# Patient Record
Sex: Female | Born: 1945 | Race: Asian | Hispanic: No | Marital: Married | Smoking: Never smoker
Health system: Southern US, Community
[De-identification: ages and names within clinical notes are randomized; demographics above are authoritative.]

## PROBLEM LIST (undated history)

## (undated) DIAGNOSIS — I10 Essential (primary) hypertension: Secondary | ICD-10-CM

## (undated) DIAGNOSIS — E785 Hyperlipidemia, unspecified: Secondary | ICD-10-CM

---

## 2014-07-02 ENCOUNTER — Observation Stay (HOSPITAL_COMMUNITY): Payer: Medicare Other | Admitting: Anesthesiology

## 2014-07-02 ENCOUNTER — Observation Stay (HOSPITAL_COMMUNITY): Payer: Medicare Other

## 2014-07-02 ENCOUNTER — Emergency Department (HOSPITAL_COMMUNITY): Payer: Medicare Other

## 2014-07-02 ENCOUNTER — Encounter (HOSPITAL_COMMUNITY)
Admission: EM | Disposition: A | Discharge status: Rehabilitation Facility | Payer: Self-pay | Source: Home / Self Care | Attending: Orthopedic Surgery

## 2014-07-02 ENCOUNTER — Inpatient Hospital Stay (HOSPITAL_COMMUNITY)
Admission: EM | Admit: 2014-07-02 | Discharge: 2014-07-08 | DRG: 481 | Disposition: A | Discharge status: Rehabilitation Facility | Payer: Medicare Other | Attending: Orthopedic Surgery | Admitting: Orthopedic Surgery

## 2014-07-02 ENCOUNTER — Encounter (HOSPITAL_COMMUNITY): Payer: Self-pay | Admitting: Emergency Medicine

## 2014-07-02 DIAGNOSIS — I10 Essential (primary) hypertension: Secondary | ICD-10-CM | POA: Diagnosis present

## 2014-07-02 DIAGNOSIS — D500 Iron deficiency anemia secondary to blood loss (chronic): Secondary | ICD-10-CM | POA: Diagnosis not present

## 2014-07-02 DIAGNOSIS — K5900 Constipation, unspecified: Secondary | ICD-10-CM | POA: Diagnosis present

## 2014-07-02 DIAGNOSIS — E785 Hyperlipidemia, unspecified: Secondary | ICD-10-CM | POA: Diagnosis present

## 2014-07-02 DIAGNOSIS — S7292XA Unspecified fracture of left femur, initial encounter for closed fracture: Secondary | ICD-10-CM

## 2014-07-02 DIAGNOSIS — S72302S Unspecified fracture of shaft of left femur, sequela: Secondary | ICD-10-CM | POA: Diagnosis not present

## 2014-07-02 DIAGNOSIS — S72302A Unspecified fracture of shaft of left femur, initial encounter for closed fracture: Secondary | ICD-10-CM | POA: Diagnosis present

## 2014-07-02 DIAGNOSIS — S7290XA Unspecified fracture of unspecified femur, initial encounter for closed fracture: Secondary | ICD-10-CM | POA: Diagnosis present

## 2014-07-02 DIAGNOSIS — D62 Acute posthemorrhagic anemia: Secondary | ICD-10-CM | POA: Diagnosis not present

## 2014-07-02 DIAGNOSIS — S72352A Displaced comminuted fracture of shaft of left femur, initial encounter for closed fracture: Principal | ICD-10-CM | POA: Diagnosis present

## 2014-07-02 DIAGNOSIS — W11XXXA Fall on and from ladder, initial encounter: Secondary | ICD-10-CM | POA: Diagnosis present

## 2014-07-02 DIAGNOSIS — S72302E Unspecified fracture of shaft of left femur, subsequent encounter for open fracture type I or II with routine healing: Secondary | ICD-10-CM | POA: Diagnosis not present

## 2014-07-02 DIAGNOSIS — E876 Hypokalemia: Secondary | ICD-10-CM | POA: Diagnosis not present

## 2014-07-02 DIAGNOSIS — W19XXXS Unspecified fall, sequela: Secondary | ICD-10-CM | POA: Diagnosis not present

## 2014-07-02 DIAGNOSIS — W19XXXD Unspecified fall, subsequent encounter: Secondary | ICD-10-CM | POA: Diagnosis not present

## 2014-07-02 DIAGNOSIS — F419 Anxiety disorder, unspecified: Secondary | ICD-10-CM | POA: Diagnosis present

## 2014-07-02 DIAGNOSIS — W19XXXA Unspecified fall, initial encounter: Secondary | ICD-10-CM

## 2014-07-02 HISTORY — DX: Essential (primary) hypertension: I10

## 2014-07-02 HISTORY — PX: ORIF FEMUR FRACTURE: SHX2119

## 2014-07-02 HISTORY — DX: Hyperlipidemia, unspecified: E78.5

## 2014-07-02 HISTORY — PX: FEMUR IM NAIL: SHX1597

## 2014-07-02 LAB — CBC WITH DIFFERENTIAL/PLATELET
BASOS ABS: 0 10*3/uL (ref 0.0–0.1)
Basophils Relative: 0 % (ref 0–1)
EOS PCT: 1 % (ref 0–5)
Eosinophils Absolute: 0.1 10*3/uL (ref 0.0–0.7)
HCT: 37.4 % (ref 36.0–46.0)
Hemoglobin: 12.4 g/dL (ref 12.0–15.0)
LYMPHS PCT: 46 % (ref 12–46)
Lymphs Abs: 3.7 10*3/uL (ref 0.7–4.0)
MCH: 30.1 pg (ref 26.0–34.0)
MCHC: 33.2 g/dL (ref 30.0–36.0)
MCV: 90.8 fL (ref 78.0–100.0)
Monocytes Absolute: 0.4 10*3/uL (ref 0.1–1.0)
Monocytes Relative: 5 % (ref 3–12)
NEUTROS PCT: 48 % (ref 43–77)
Neutro Abs: 3.9 10*3/uL (ref 1.7–7.7)
Platelets: 210 10*3/uL (ref 150–400)
RBC: 4.12 MIL/uL (ref 3.87–5.11)
RDW: 13.2 % (ref 11.5–15.5)
WBC: 8.1 10*3/uL (ref 4.0–10.5)

## 2014-07-02 LAB — BASIC METABOLIC PANEL
Anion gap: 20 — ABNORMAL HIGH (ref 5–15)
BUN: 13 mg/dL (ref 6–23)
CO2: 21 mEq/L (ref 19–32)
Calcium: 9.2 mg/dL (ref 8.4–10.5)
Chloride: 101 mEq/L (ref 96–112)
Creatinine, Ser: 0.48 mg/dL — ABNORMAL LOW (ref 0.50–1.10)
GFR calc Af Amer: >90 mL/min (ref >90)
GFR calc non Af Amer: >90 mL/min (ref >90)
Glucose, Bld: 111 mg/dL — ABNORMAL HIGH (ref 70–99)
POTASSIUM: 3.3 meq/L — AB (ref 3.7–5.3)
Sodium: 142 mEq/L (ref 137–147)

## 2014-07-02 LAB — I-STAT CHEM 8, ED
BUN: 12 mg/dL (ref 6–23)
CREATININE: 0.5 mg/dL (ref 0.50–1.10)
Calcium, Ion: 1.06 mmol/L — ABNORMAL LOW (ref 1.13–1.30)
Chloride: 102 mEq/L (ref 96–112)
Glucose, Bld: 130 mg/dL — ABNORMAL HIGH (ref 70–99)
HEMATOCRIT: 40 % (ref 36.0–46.0)
Hemoglobin: 13.6 g/dL (ref 12.0–15.0)
Potassium: 2.9 mEq/L — CL (ref 3.7–5.3)
SODIUM: 140 meq/L (ref 137–147)
TCO2: 21 mmol/L (ref 0–100)

## 2014-07-02 LAB — PROTIME-INR
INR: 0.9 (ref 0.00–1.49)
Prothrombin Time: 12.3 seconds (ref 11.6–15.2)

## 2014-07-02 SURGERY — INSERTION, INTRAMEDULLARY ROD, FEMUR
Anesthesia: General | Laterality: Left

## 2014-07-02 MED ORDER — HYDROCODONE-ACETAMINOPHEN 5-325 MG PO TABS
1.0000 | ORAL_TABLET | Freq: Four times a day (QID) | ORAL | Status: DC | PRN
  Administered 2014-07-03: 2 via ORAL
  Administered 2014-07-03: 1 via ORAL
  Administered 2014-07-03 – 2014-07-04 (×3): 2 via ORAL
  Administered 2014-07-05: 1 via ORAL
  Administered 2014-07-05: 2 via ORAL
  Administered 2014-07-05 – 2014-07-07 (×4): 1 via ORAL
  Filled 2014-07-02 (×4): qty 1
  Filled 2014-07-02 (×4): qty 2
  Filled 2014-07-02 (×2): qty 1
  Filled 2014-07-02: qty 2

## 2014-07-02 MED ORDER — DOCUSATE SODIUM 100 MG PO CAPS
100.0000 mg | ORAL_CAPSULE | Freq: Two times a day (BID) | ORAL | Status: DC
  Administered 2014-07-03 – 2014-07-08 (×12): 100 mg via ORAL
  Filled 2014-07-02 (×13): qty 1

## 2014-07-02 MED ORDER — AMLODIPINE BESYLATE 5 MG PO TABS
5.0000 mg | ORAL_TABLET | Freq: Every day | ORAL | Status: DC
  Administered 2014-07-03 – 2014-07-08 (×6): 5 mg via ORAL
  Filled 2014-07-02 (×7): qty 1

## 2014-07-02 MED ORDER — CEFAZOLIN SODIUM 1-5 GM-% IV SOLN
INTRAVENOUS | Status: AC
  Filled 2014-07-02: qty 50

## 2014-07-02 MED ORDER — HYDROMORPHONE HCL 1 MG/ML IJ SOLN
0.5000 mg | INTRAMUSCULAR | Status: DC | PRN
  Administered 2014-07-02: 0.5 mg via INTRAVENOUS
  Filled 2014-07-02: qty 1

## 2014-07-02 MED ORDER — METHOCARBAMOL 1000 MG/10ML IJ SOLN
500.0000 mg | Freq: Four times a day (QID) | INTRAVENOUS | Status: DC | PRN
  Administered 2014-07-02: 500 mg via INTRAVENOUS
  Filled 2014-07-02 (×2): qty 5

## 2014-07-02 MED ORDER — FENTANYL CITRATE 0.05 MG/ML IJ SOLN
INTRAMUSCULAR | Status: AC
  Administered 2014-07-02: 50 ug via INTRAVENOUS
  Filled 2014-07-02: qty 2

## 2014-07-02 MED ORDER — FENTANYL CITRATE 0.05 MG/ML IJ SOLN
50.0000 ug | Freq: Once | INTRAMUSCULAR | Status: AC
  Administered 2014-07-02: 50 ug via INTRAVENOUS

## 2014-07-02 MED ORDER — MIDAZOLAM HCL 2 MG/2ML IJ SOLN
INTRAMUSCULAR | Status: AC
  Filled 2014-07-02: qty 2

## 2014-07-02 MED ORDER — SODIUM CHLORIDE 0.9 % IV SOLN
INTRAVENOUS | Status: DC
  Administered 2014-07-02: 13:00:00 via INTRAVENOUS

## 2014-07-02 MED ORDER — SODIUM CHLORIDE 0.9 % IV SOLN
INTRAVENOUS | Status: DC
  Administered 2014-07-02 – 2014-07-04 (×2): via INTRAVENOUS
  Filled 2014-07-02 (×12): qty 1000

## 2014-07-02 MED ORDER — PHENYLEPHRINE 40 MCG/ML (10ML) SYRINGE FOR IV PUSH (FOR BLOOD PRESSURE SUPPORT)
PREFILLED_SYRINGE | INTRAVENOUS | Status: AC
  Filled 2014-07-02: qty 10

## 2014-07-02 MED ORDER — PHENYLEPHRINE HCL 10 MG/ML IJ SOLN
INTRAMUSCULAR | Status: DC | PRN
  Administered 2014-07-02 (×4): 80 ug via INTRAVENOUS
  Administered 2014-07-02 (×2): 120 ug via INTRAVENOUS
  Administered 2014-07-02 (×2): 80 ug via INTRAVENOUS
  Administered 2014-07-02: 120 ug via INTRAVENOUS
  Administered 2014-07-02 (×2): 80 ug via INTRAVENOUS

## 2014-07-02 MED ORDER — ONDANSETRON HCL 4 MG/2ML IJ SOLN: 4.0000 mg | Freq: Four times a day (QID) | INTRAMUSCULAR | Status: DC | PRN

## 2014-07-02 MED ORDER — MIDAZOLAM HCL 5 MG/5ML IJ SOLN
INTRAMUSCULAR | Status: DC | PRN
  Administered 2014-07-02: 1 mg via INTRAVENOUS

## 2014-07-02 MED ORDER — SUCCINYLCHOLINE CHLORIDE 20 MG/ML IJ SOLN
INTRAMUSCULAR | Status: DC | PRN
  Administered 2014-07-02: 100 mg via INTRAVENOUS

## 2014-07-02 MED ORDER — SUCCINYLCHOLINE CHLORIDE 20 MG/ML IJ SOLN
INTRAMUSCULAR | Status: AC
  Filled 2014-07-02: qty 1

## 2014-07-02 MED ORDER — MENTHOL 3 MG MT LOZG: 1.0000 | LOZENGE | OROMUCOSAL | Status: DC | PRN

## 2014-07-02 MED ORDER — CEFAZOLIN SODIUM 1-5 GM-% IV SOLN
INTRAVENOUS | Status: DC | PRN
  Administered 2014-07-02: 1 g via INTRAVENOUS

## 2014-07-02 MED ORDER — POLYETHYLENE GLYCOL 3350 17 G PO PACK: 17.0000 g | PACK | Freq: Every day | ORAL | Status: DC | PRN

## 2014-07-02 MED ORDER — PROMETHAZINE HCL 25 MG/ML IJ SOLN
6.2500 mg | INTRAMUSCULAR | Status: DC | PRN
Start: 2014-07-02 — End: 2014-07-02

## 2014-07-02 MED ORDER — BISACODYL 10 MG RE SUPP: 10.0000 mg | Freq: Every day | RECTAL | Status: DC | PRN

## 2014-07-02 MED ORDER — HYDROMORPHONE HCL 1 MG/ML IJ SOLN: 0.5000 mg | INTRAMUSCULAR | Status: DC | PRN

## 2014-07-02 MED ORDER — FENTANYL CITRATE 0.05 MG/ML IJ SOLN
INTRAMUSCULAR | Status: DC | PRN
  Administered 2014-07-02 (×3): 50 ug via INTRAVENOUS

## 2014-07-02 MED ORDER — ONDANSETRON HCL 4 MG/2ML IJ SOLN
INTRAMUSCULAR | Status: DC | PRN
  Administered 2014-07-02: 4 mg via INTRAVENOUS

## 2014-07-02 MED ORDER — FENTANYL CITRATE 0.05 MG/ML IJ SOLN
INTRAMUSCULAR | Status: AC
  Administered 2014-07-02: 25 ug via INTRAVENOUS
  Filled 2014-07-02: qty 2

## 2014-07-02 MED ORDER — LIDOCAINE HCL (CARDIAC) 20 MG/ML IV SOLN
INTRAVENOUS | Status: AC
  Filled 2014-07-02: qty 5

## 2014-07-02 MED ORDER — CEFAZOLIN SODIUM-DEXTROSE 2-3 GM-% IV SOLR
2.0000 g | Freq: Once | INTRAVENOUS | Status: DC
  Filled 2014-07-02: qty 50

## 2014-07-02 MED ORDER — FENTANYL CITRATE 0.05 MG/ML IJ SOLN
50.0000 ug | INTRAMUSCULAR | Status: DC | PRN
  Administered 2014-07-02: 50 ug via INTRAVENOUS

## 2014-07-02 MED ORDER — CEFAZOLIN SODIUM-DEXTROSE 2-3 GM-% IV SOLR
2.0000 g | Freq: Four times a day (QID) | INTRAVENOUS | Status: AC
  Administered 2014-07-02 – 2014-07-03 (×2): 2 g via INTRAVENOUS
  Filled 2014-07-02 (×2): qty 50

## 2014-07-02 MED ORDER — LACTATED RINGERS IV SOLN
INTRAVENOUS | Status: DC
  Administered 2014-07-02: 17:00:00 via INTRAVENOUS

## 2014-07-02 MED ORDER — 0.9 % SODIUM CHLORIDE (POUR BTL) OPTIME
TOPICAL | Status: DC | PRN
  Administered 2014-07-02: 1000 mL

## 2014-07-02 MED ORDER — FENTANYL CITRATE 0.05 MG/ML IJ SOLN
INTRAMUSCULAR | Status: AC
  Filled 2014-07-02: qty 5

## 2014-07-02 MED ORDER — FERROUS SULFATE 325 (65 FE) MG PO TABS
325.0000 mg | ORAL_TABLET | Freq: Three times a day (TID) | ORAL | Status: DC
Start: 2014-07-03 — End: 2014-07-08
  Administered 2014-07-03 – 2014-07-08 (×15): 325 mg via ORAL
  Filled 2014-07-02 (×19): qty 1

## 2014-07-02 MED ORDER — PHENOL 1.4 % MT LIQD: 1.0000 | OROMUCOSAL | Status: DC | PRN

## 2014-07-02 MED ORDER — LACTATED RINGERS IV SOLN
INTRAVENOUS | Status: DC | PRN
  Administered 2014-07-02: 17:00:00 via INTRAVENOUS

## 2014-07-02 MED ORDER — ALUM & MAG HYDROXIDE-SIMETH 200-200-20 MG/5ML PO SUSP: 30.0000 mL | ORAL | Status: DC | PRN

## 2014-07-02 MED ORDER — FENTANYL CITRATE 0.05 MG/ML IJ SOLN
25.0000 ug | INTRAMUSCULAR | Status: DC | PRN
  Administered 2014-07-02 (×2): 25 ug via INTRAVENOUS

## 2014-07-02 MED ORDER — PROPOFOL 10 MG/ML IV BOLUS
INTRAVENOUS | Status: AC
  Filled 2014-07-02: qty 20

## 2014-07-02 MED ORDER — LIDOCAINE HCL (CARDIAC) 20 MG/ML IV SOLN
INTRAVENOUS | Status: DC | PRN
  Administered 2014-07-02: 50 mg via INTRAVENOUS

## 2014-07-02 MED ORDER — ONDANSETRON HCL 4 MG PO TABS: 4.0000 mg | ORAL_TABLET | Freq: Four times a day (QID) | ORAL | Status: DC | PRN

## 2014-07-02 MED ORDER — MEPERIDINE HCL 25 MG/ML IJ SOLN
INTRAMUSCULAR | Status: AC
  Administered 2014-07-02: 12.5 mg via INTRAVENOUS
  Filled 2014-07-02: qty 1

## 2014-07-02 MED ORDER — PRAVASTATIN SODIUM 20 MG PO TABS
20.0000 mg | ORAL_TABLET | Freq: Every day | ORAL | Status: DC
  Administered 2014-07-02 – 2014-07-08 (×7): 20 mg via ORAL
  Filled 2014-07-02 (×7): qty 1

## 2014-07-02 MED ORDER — ENOXAPARIN SODIUM 30 MG/0.3ML ~~LOC~~ SOLN
20.0000 mg | SUBCUTANEOUS | Status: DC
  Administered 2014-07-03 – 2014-07-08 (×6): 20 mg via SUBCUTANEOUS
  Filled 2014-07-02 (×11): qty 0.2

## 2014-07-02 MED ORDER — METOCLOPRAMIDE HCL 5 MG/ML IJ SOLN: 5.0000 mg | Freq: Three times a day (TID) | INTRAMUSCULAR | Status: DC | PRN

## 2014-07-02 MED ORDER — ONDANSETRON HCL 4 MG/2ML IJ SOLN
INTRAMUSCULAR | Status: AC
  Filled 2014-07-02: qty 2

## 2014-07-02 MED ORDER — METHOCARBAMOL 500 MG PO TABS
500.0000 mg | ORAL_TABLET | Freq: Four times a day (QID) | ORAL | Status: DC | PRN
  Filled 2014-07-02: qty 1

## 2014-07-02 MED ORDER — PROPOFOL 10 MG/ML IV BOLUS
INTRAVENOUS | Status: DC | PRN
  Administered 2014-07-02: 130 mg via INTRAVENOUS

## 2014-07-02 MED ORDER — MAGNESIUM CITRATE PO SOLN: 1.0000 | Freq: Once | ORAL | Status: AC | PRN

## 2014-07-02 MED ORDER — METOCLOPRAMIDE HCL 10 MG PO TABS: 5.0000 mg | ORAL_TABLET | Freq: Three times a day (TID) | ORAL | Status: DC | PRN

## 2014-07-02 MED ORDER — MEPERIDINE HCL 25 MG/ML IJ SOLN
12.5000 mg | INTRAMUSCULAR | Status: DC | PRN
  Administered 2014-07-02: 12.5 mg via INTRAVENOUS

## 2014-07-02 MED ORDER — DIPHENHYDRAMINE HCL 25 MG PO CAPS: 25.0000 mg | ORAL_CAPSULE | Freq: Four times a day (QID) | ORAL | Status: DC | PRN

## 2014-07-02 SURGICAL SUPPLY — 48 items
BIT DRILL 3.8X6 NS (BIT) ×2 IMPLANT
BIT DRILL 5.3 NS (BIT) ×2 IMPLANT
COVER LIGHT HANDLE  DEROYL (MISCELLANEOUS) ×2 IMPLANT
COVER PERINEAL POST (MISCELLANEOUS) ×2 IMPLANT
DRAPE STERI IOBAN 125X83 (DRAPES) ×2 IMPLANT
DRAPE SURG 17X23 STRL (DRAPES) ×2 IMPLANT
DRSG ADAPTIC 3X8 NADH LF (GAUZE/BANDAGES/DRESSINGS) ×2 IMPLANT
DRSG MEPILEX BORDER 4X12 (GAUZE/BANDAGES/DRESSINGS) IMPLANT
DRSG MEPILEX BORDER 4X4 (GAUZE/BANDAGES/DRESSINGS) ×6 IMPLANT
DRSG MEPILEX BORDER 4X8 (GAUZE/BANDAGES/DRESSINGS) IMPLANT
DURAPREP 26ML APPLICATOR (WOUND CARE) ×2 IMPLANT
ELECT REM PT RETURN 9FT ADLT (ELECTROSURGICAL) ×2
ELECTRODE REM PT RTRN 9FT ADLT (ELECTROSURGICAL) ×1 IMPLANT
EVACUATOR 1/8 PVC DRAIN (DRAIN) IMPLANT
GAUZE SPONGE 4X4 12PLY STRL (GAUZE/BANDAGES/DRESSINGS) ×2 IMPLANT
GLOVE BIOGEL PI IND STRL 7.5 (GLOVE) ×1 IMPLANT
GLOVE BIOGEL PI IND STRL 8 (GLOVE) ×1 IMPLANT
GLOVE BIOGEL PI INDICATOR 7.5 (GLOVE) ×1
GLOVE BIOGEL PI INDICATOR 8 (GLOVE) ×1
GLOVE ORTHO TXT STRL SZ7.5 (GLOVE) ×2 IMPLANT
GLOVE SURG ORTHO 8.0 STRL STRW (GLOVE) ×2 IMPLANT
GOWN STRL REUS W/ TWL LRG LVL3 (GOWN DISPOSABLE) ×3 IMPLANT
GOWN STRL REUS W/TWL LRG LVL3 (GOWN DISPOSABLE) ×3
GUIDEPIN 3.2X17.5 THRD DISP (PIN) ×2 IMPLANT
GUIDEWIRE BALL NOSE 100CM (WIRE) ×2 IMPLANT
KIT BASIN OR (CUSTOM PROCEDURE TRAY) ×2 IMPLANT
KIT ROOM TURNOVER OR (KITS) ×2 IMPLANT
LINER BOOT UNIVERSAL DISP (MISCELLANEOUS) IMPLANT
MANIFOLD NEPTUNE II (INSTRUMENTS) IMPLANT
NAIL TROCH 9X34 (Nail) ×2 IMPLANT
NS IRRIG 1000ML POUR BTL (IV SOLUTION) ×2 IMPLANT
PACK GENERAL/GYN (CUSTOM PROCEDURE TRAY) ×2 IMPLANT
PAD ARMBOARD 7.5X6 YLW CONV (MISCELLANEOUS) ×4 IMPLANT
SCREW ACE CORTICAL (Screw) ×1 IMPLANT
SCREW ACECAP 36MM (Screw) ×2 IMPLANT
SCREW ACECAP 40MM (Screw) ×2 IMPLANT
SCREW BN FT 60X6.5XST DRV (Screw) ×1 IMPLANT
SCREW CORT BONE 4.5X38 1402238 (Screw) ×2 IMPLANT
STAPLER VISISTAT 35W (STAPLE) ×2 IMPLANT
SUT VIC AB 0 CT1 27 (SUTURE) ×2
SUT VIC AB 0 CT1 27XBRD ANBCTR (SUTURE) ×2 IMPLANT
SUT VIC AB 1 CT1 27 (SUTURE) ×1
SUT VIC AB 1 CT1 27XBRD ANBCTR (SUTURE) ×1 IMPLANT
SUT VIC AB 2-0 CT1 27 (SUTURE) ×1
SUT VIC AB 2-0 CT1 TAPERPNT 27 (SUTURE) ×1 IMPLANT
TOWEL OR 17X24 6PK STRL BLUE (TOWEL DISPOSABLE) ×2 IMPLANT
TOWEL OR 17X26 10 PK STRL BLUE (TOWEL DISPOSABLE) ×2 IMPLANT
WATER STERILE IRR 1000ML POUR (IV SOLUTION) IMPLANT

## 2014-07-02 NOTE — ED Notes (Signed)
From home via [facility], fell 3 feet - EMS reports obvious left femur deformity, good distal CMS, traction splint in place, VSS, no LOC or head trauma, no other injuries noted, 125 mcg Fentanyl pta

## 2014-07-02 NOTE — Anesthesia Postprocedure Evaluation (Signed)
  Anesthesia Post-op Note  Patient: [name]  Procedure(s) Performed: Procedure(s): ORIF FEMORAL NAIL (Left)  Patient Location: PACU  Anesthesia Type:General  Level of Consciousness: awake and alert   Airway and Oxygen Therapy: Patient Spontanous Breathing  Post-op Pain: mild  Post-op Assessment: Post-op Vital signs reviewed  Post-op Vital Signs: stable  Last Vitals:  Filed Vitals:   07/02/14 2015  BP: 124/54  Pulse: 82  Temp: 36.3 C  Resp: 11    Complications: No apparent anesthesia complications

## 2014-07-02 NOTE — Progress Notes (Signed)
ANTICOAGULATION CONSULT NOTE - Initial Consult  Pharmacy Consult for lovenox Indication: VTE px  No Known Allergies  Patient Measurements: Height: 5\' 2"  (157.5 cm) Weight: 90 lb (40.824 kg) IBW/kg (Calculated) : 50.1  Dosing Weight: 40.8 kg  Vital Signs: Temp: 97.4 F (36.3 C) (11/11 2015) BP: 124/54 mmHg (11/11 2015) Pulse Rate: 82 (11/11 2015)  Labs:  Recent Labs  07/02/14 1220 07/02/14 1232  HGB 12.4 13.6  HCT 37.4 40.0  PLT 210  --   LABPROT 12.3  --   INR 0.90  --   CREATININE 0.48* 0.50    Estimated Creatinine Clearance: 42.7 mL/min (by C-G formula based on Cr of 0.5).   Medical History: Past Medical History  Diagnosis Date  . Hypertension     Medications:  Prescriptions prior to admission  Medication Sig Dispense Refill Last Dose  . amLODipine (NORVASC) 5 MG tablet Take 5 mg by mouth daily.   07/01/2014 at Unknown time  . pravastatin (PRAVACHOL) 20 MG tablet Take 20 mg by mouth daily.   07/01/2014 at Unknown time  . Vitamin D, Ergocalciferol, (DRISDOL) 50000 UNITS CAPS capsule Take 50,000 Units by mouth every 7 (seven) days. Mondays   Past Week at Unknown time    Assessment: 68 yo lady to start lovenox for VTE px.  She weighs 40 kg and CrCl ~43 ml/min Goal of Therapy:  Prevention of VTE Monitor platelets by anticoagulation protocol: Yes   Plan:  Lovenox 20 mg sq q24 hours CBC q 3 days Monitor for bleeding complications  Thanks for allowing pharmacy to be a part of this patient's care.  Talbert CageLora Addeline Calarco, PharmD Clinical Pharmacist, [phone] 07/02/2014,8:58 PM

## 2014-07-02 NOTE — Brief Op Note (Signed)
07/02/2014  6:57 PM  PATIENT:  [name]  68 y.o. female  PRE-OPERATIVE DIAGNOSIS:  Closed left midshaft femur fracture  POST-OPERATIVE DIAGNOSIS: Closed left midshaft femur fracture  PROCEDURE:  Procedure(s): ORIF FEMORAL NAIL (Left)  SURGEON:  Surgeon(s) and Role:    * Shelda PalMatthew D Acadia Thammavong, MD - Primary  PHYSICIAN ASSISTANT: Lanney GinsMatthew Babish, PA-C  ANESTHESIA:   general  EBL:  Total I/O In: 400 [I.V.:400] Out: 300 [Urine:300]  BLOOD ADMINISTERED:none  DRAINS: none   LOCAL MEDICATIONS USED:  NONE  SPECIMEN:  No Specimen  DISPOSITION OF SPECIMEN:  N/A  COUNTS:  YES  TOURNIQUET:  * No tourniquets in log *  DICTATION: .Other Dictation: Dictation Number [id number]  PLAN OF CARE: Admit to inpatient   PATIENT DISPOSITION:  PACU - hemodynamically stable.   Delay start of Pharmacological VTE agent (>24hrs) due to surgical blood loss or risk of bleeding: no

## 2014-07-02 NOTE — Interval H&P Note (Signed)
History and Physical Interval Note:  07/02/2014 5:00 PM  [name]  has presented today for surgery, with the diagnosis of Left Femur Fracture  The various methods of treatment have been discussed with the patient and family. After consideration of risks, benefits and other options for treatment, the patient has consented to  Procedure(s): ORIF FEMORAL NAIL (Left) as a surgical intervention .  The patient's history has been reviewed, patient examined, no change in status, stable for surgery.  I have reviewed the patient's chart and labs.  Questions were answered to the patient's satisfaction.     Shelda PalLIN,Kroy Sprung D

## 2014-07-02 NOTE — ED Notes (Signed)
Orthopedic Surgery at bedside.

## 2014-07-02 NOTE — Progress Notes (Signed)
   07/02/14 1300  Clinical Encounter Type  Visited With Patient;Family  Visit Type ED;Trauma   Chaplain was paged for a Level 2 Trauma at 12 PM. Patient's granddaughter was present when patient was being brought into the ED. Patient's granddaughter said their were more family members on the way. Chaplain introduced himself to more family members in the ED lobby and escorted them to be with the patient. Chaplain dialogued primarily with the patient's daughter-in-law, who explained that her and the patient were in [place] from [place] visiting other family members. Family members were with patient when Chaplain left. Page Merrilyn Puman-Call Chaplain if needed. Shavonte Zhao, Tommi EmeryBlake R, Chaplain  1:26 PM

## 2014-07-02 NOTE — Progress Notes (Signed)
Orthopedic Tech Progress Note Patient Details:  [name] 1946/05/06 [id number]  Patient ID: Karla DakinNau Macapagal, female   DOB: 1946/05/06, 68 y.o.   MRN: [id number] Made level 2 trauma visit  Nikki DomCrawford, Caitlyne Ingham 07/02/2014, 12:39 PM

## 2014-07-02 NOTE — ED Provider Notes (Signed)
CSN: [id number]     Arrival date & time 07/02/14  1213 History   First MD Initiated Contact with Patient 07/02/14 1219     Chief Complaint  Patient presents with  . Trauma      HPI  Pt was seen at 1215. Per EMS, pt's family, and pt report: c/o sudden onset and persistence of constant left femur "pain" that began PTA. Pt states she was up on a small step ladder approximately 2 to 3 feet when she "stepped backwards" with her left leg. States her left leg "hit the ground hard" and "I felt my femur break." Pt states she then sat down onto her buttocks. Pt was unable to stand due to pain in her left femur. Pt denies head injury, no neck or back pain, no CP/SOB, no abd pain, no focal motor weakness, no tingling/numbness in extremities. EMS applied Hare traction to LLE and gave IV fentanyl en route.    Past Medical History  Diagnosis Date  . Hypertension    History reviewed. No pertinent past surgical history.  History  Substance Use Topics  . Smoking status: Never Smoker   . Smokeless tobacco: Not on file  . Alcohol Use: No    Review of Systems ROS: Statement: All systems negative except as marked or noted in the HPI; Constitutional: Negative for fever and chills. ; ; Eyes: Negative for eye pain, redness and discharge. ; ; ENMT: Negative for ear pain, hoarseness, nasal congestion, sinus pressure and sore throat. ; ; Cardiovascular: Negative for chest pain, palpitations, diaphoresis, dyspnea and peripheral edema. ; ; Respiratory: Negative for cough, wheezing and stridor. ; ; Gastrointestinal: Negative for nausea, vomiting, diarrhea, abdominal pain, blood in stool, hematemesis, jaundice and rectal bleeding. . ; ; Genitourinary: Negative for dysuria, flank pain and hematuria. ; ; Musculoskeletal: +left femur pain. Negative for back pain and neck pain. Negative for swelling.; ; Skin: Negative for pruritus, rash, abrasions, blisters, bruising and skin lesion.; ; Neuro: Negative for headache,  lightheadedness and neck stiffness. Negative for weakness, altered level of consciousness , altered mental status, extremity weakness, paresthesias, involuntary movement, seizure and syncope.     Allergies  Review of patient's allergies indicates no known allergies.  Home Medications   Prior to Admission medications   Medication Sig Start Date End Date Taking? Authorizing Provider  amLODipine (NORVASC) 5 MG tablet Take 5 mg by mouth daily.   Yes Historical Provider, MD  pravastatin (PRAVACHOL) 20 MG tablet Take 20 mg by mouth daily.   Yes Historical Provider, MD  Vitamin D, Ergocalciferol, (DRISDOL) 50000 UNITS CAPS capsule Take 50,000 Units by mouth every 7 (seven) days. Mondays   Yes Historical Provider, MD   BP 125/56 mmHg  Pulse 76  Temp(Src) 98.1 F (36.7 C)  Resp 20  SpO2 100%  LMP  (LMP Unknown) Physical Exam  1220: Physical examination: Vital signs and O2 SAT: Reviewed; Constitutional: Well developed, Well nourished, Well hydrated, Uncomfortable appearing.; Head and Face: Normocephalic, Atraumatic; Eyes: EOMI, PERRL, No scleral icterus; ENMT: Mouth and pharynx normal, Left TM normal, Right TM normal, Mucous membranes moist; Neck: Supple, Trachea midline; Spine: No midline CS, TS, LS tenderness.; Cardiovascular: Regular rate and rhythm, No gallop; Respiratory: Breath sounds clear & equal bilaterally, No wheezes, Normal respiratory effort/excursion; Chest: Nontender, No deformity, Movement normal, No crepitus, No abrasions or ecchymosis.; Abdomen: Soft, Nontender, Nondistended, Normal bowel sounds, No abrasions or ecchymosis.; Genitourinary: No CVA tenderness;; Extremities:  +Hare traction LLE with left mid-femur TTP, otherwise full  range of motion major/large joints of bilat UE's and LE's without pain or tenderness to palp, Neurovascularly intact, Pulses normal. No LLE ecchymosis, no erythema, no open wounds. Pelvis stable; Neuro: AA&Ox3, GCS 15.  Major CN grossly intact. Speech clear.  No gross focal motor or sensory deficits in extremities.; Skin: Color normal, Warm, Dry   ED Course  Procedures  EKG Interpretation None      MDM  MDM Reviewed: nursing note and vitals Interpretation: labs and x-ray      Results for orders placed or performed during the hospital encounter of 07/02/14  CBC with Differential  Result Value Ref Range   WBC 8.1 4.0 - 10.5 K/uL   RBC 4.12 3.87 - 5.11 MIL/uL   Hemoglobin 12.4 12.0 - 15.0 g/dL   HCT 84.637.4 96.236.0 - 95.246.0 %   MCV 90.8 78.0 - 100.0 fL   MCH 30.1 26.0 - 34.0 pg   MCHC 33.2 30.0 - 36.0 g/dL   RDW 84.113.2 32.411.5 - 40.115.5 %   Platelets 210 150 - 400 K/uL   Neutrophils Relative % 48 43 - 77 %   Neutro Abs 3.9 1.7 - 7.7 K/uL   Lymphocytes Relative 46 12 - 46 %   Lymphs Abs 3.7 0.7 - 4.0 K/uL   Monocytes Relative 5 3 - 12 %   Monocytes Absolute 0.4 0.1 - 1.0 K/uL   Eosinophils Relative 1 0 - 5 %   Eosinophils Absolute 0.1 0.0 - 0.7 K/uL   Basophils Relative 0 0 - 1 %   Basophils Absolute 0.0 0.0 - 0.1 K/uL  Protime-INR  Result Value Ref Range   Prothrombin Time 12.3 11.6 - 15.2 seconds   INR 0.90 0.00 - 1.49  Basic metabolic panel  Result Value Ref Range   Sodium 142 137 - 147 mEq/L   Potassium 3.3 (L) 3.7 - 5.3 mEq/L   Chloride 101 96 - 112 mEq/L   CO2 21 19 - 32 mEq/L   Glucose, Bld 111 (H) 70 - 99 mg/dL   BUN 13 6 - 23 mg/dL   Creatinine, Ser 0.270.48 (L) 0.50 - 1.10 mg/dL   Calcium 9.2 8.4 - 25.310.5 mg/dL   GFR calc non Af Amer >90 >90 mL/min   GFR calc Af Amer >90 >90 mL/min   Anion gap 20 (H) 5 - 15  I-stat Chem 8, ED  Result Value Ref Range   Sodium 140 137 - 147 mEq/L   Potassium 2.9 (LL) 3.7 - 5.3 mEq/L   Chloride 102 96 - 112 mEq/L   BUN 12 6 - 23 mg/dL   Creatinine, Ser 6.640.50 0.50 - 1.10 mg/dL   Glucose, Bld 403130 (H) 70 - 99 mg/dL   Calcium, Ion 4.741.06 (L) 1.13 - 1.30 mmol/L   TCO2 21 0 - 100 mmol/L   Hemoglobin 13.6 12.0 - 15.0 g/dL   HCT 25.940.0 56.336.0 - 87.546.0 %   Comment NOTIFIED PHYSICIAN    Dg Pelvis  Portable 07/02/2014   CLINICAL DATA:  Patient fell approximately 3 feet  EXAM: PORTABLE PELVIS 1-2 VIEWS  COMPARISON:  None.  FINDINGS: There is overlying metallic hardware on the left. There is no apparent fracture or dislocation. There is mild symmetric narrowing of both hip joints. No erosive change.  IMPRESSION: No demonstrable fracture or dislocation.   Electronically Signed   By: Bretta BangWilliam  Woodruff M.D.   On: 07/02/2014 13:01   Dg Chest Port 1 View 07/02/2014   CLINICAL DATA:  Patient fell approximately 3 feet  EXAM: PORTABLE CHEST - 1 VIEW  COMPARISON:  None.  FINDINGS: There is an area of calcification either in or overlying the right apex. Elsewhere lungs are clear. Heart is upper normal in size with pulmonary vascularity within normal limits. No pneumothorax. No adenopathy. No fractures are apparent. There is a small bone island in the proximal right humerus.  IMPRESSION: No edema or consolidation.  No apparent pneumothorax.   Electronically Signed   By: Bretta BangWilliam  Woodruff M.D.   On: 07/02/2014 12:59   Dg Femur Left Port 07/02/2014   CLINICAL DATA:  Trauma.  Fall from a 3 foot height.  Hypertension.  EXAM: PORTABLE LEFT FEMUR - 2 VIEW  COMPARISON:  None.  FINDINGS: Comminuted displaced fracture of the midshaft of the femur noted with a intermediary butterfly fragment displaced posteriorly 1.7 cm from the proximal fragment, and with the distal fragment posteriorly displaced about 3 cm from the proximal fragment. 11 degrees of angulation between the main proximal fragment in the main distal fragment.  IMPRESSION: 1. Mildly comminuted midshaft femur fracture, moderately displaced as detailed above.   Electronically Signed   By: Herbie BaltimoreWalt  Liebkemann M.D.   On: 07/02/2014 13:01    1300:  IV fentanyl given for pain with good effect. VS remain stable, resps easy, abd soft/NT. I-stat with hypokalemia; phlebotomy tech has re-drawn BMP and sent it to the main lab. T/C to Ortho Dr. Charlann Boxerlin, case discussed,  including:  HPI, pertinent PM/SHx, VS/PE, dx testing, ED course and treatment:  Agreeable to admit, states OR will not be available for the next several hours/possibly around 1600, requests to admit pt to medical floor, write temporary orders to include: Buck's traction 5-10lbs, dilaudid 0.5mg  IV q2h prn pain, IV robaxin prn. Dx and testing, as well as d/w Ortho MD, d/w pt and family.  Questions answered.  Verb understanding, agreeable to admit.  1550:  BMP without significant hypokalemia. Pt remains in the ED due to floor bed unavailable. Hare traction LLE in place with strong pedal pulses left foot. Pt's VS remain stable. Ortho team to come to ED now for evaluation for OR repair.    Samuel JesterKathleen Baltazar Pekala, DO 07/03/14 [phone]

## 2014-07-02 NOTE — Anesthesia Preprocedure Evaluation (Signed)
Anesthesia Evaluation  Patient identified by MRN, date of birth, ID band Patient awake    Reviewed: Allergy & Precautions, H&P , NPO status , Patient's Chart, lab work & pertinent test results  Airway        Dental   Pulmonary neg pulmonary ROS,          Cardiovascular hypertension, Pt. on medications     Neuro/Psych negative neurological ROS  negative psych ROS   GI/Hepatic negative GI ROS, Neg liver ROS,   Endo/Other  negative endocrine ROS  Renal/GU negative Renal ROS     Musculoskeletal negative musculoskeletal ROS (+)   Abdominal   Peds  Hematology negative hematology ROS (+)   Anesthesia Other Findings   Reproductive/Obstetrics                             Anesthesia Physical Anesthesia Plan  ASA: II  Anesthesia Plan: General   Post-op Pain Management:    Induction: Intravenous and Rapid sequence  Airway Management Planned: Oral ETT  Additional Equipment:   Intra-op Plan:   Post-operative Plan: Extubation in OR  Informed Consent: I have reviewed the patients History and Physical, chart, labs and discussed the procedure including the risks, benefits and alternatives for the proposed anesthesia with the patient or authorized representative who has indicated his/her understanding and acceptance.   Dental advisory given  Plan Discussed with: CRNA and Surgeon  Anesthesia Plan Comments:         Anesthesia Quick Evaluation

## 2014-07-02 NOTE — ED Notes (Signed)
Strong left DP pulse noted

## 2014-07-02 NOTE — Transfer of Care (Signed)
Immediate Anesthesia Transfer of Care Note  Patient: [name]  Procedure(s) Performed: Procedure(s): ORIF FEMORAL NAIL (Left)  Patient Location: PACU  Anesthesia Type:General  Level of Consciousness: awake and alert   Airway & Oxygen Therapy: Patient Spontanous Breathing and Patient connected to nasal cannula oxygen  Post-op Assessment: Report given to PACU RN and Post -op Vital signs reviewed and stable  Post vital signs: Reviewed and stable  Complications: No apparent anesthesia complications

## 2014-07-02 NOTE — H&P (Signed)
[name] is an 68 y.o. female.    Chief Complaint:    Left comminuted displaced fracture of the midshaft of the femur  HPI: Pt is a 69 y.o. female complaining of left upper leg pain after a fall earlier today. She was getting down off a step stool, stepped off and fell back with instant left leg pain.  She was subsequently was brought to the ER where x-rays revealed a comminuted displaced fracture of the midshaft of the femur. ED physician consult Dr. Alvan Dame.  Last time she ate was a small snack at 1000, she didn't eat breakfast today.  She is from [place], [place] and is curious on how long she will have to stay in the hospital.   Various options are discussed with the patient through a relative as a translator, and the procedure was explained.  Risks, benefits and expectations were discussed with the patient and family. Patient and family understand the risks, benefits and expectations and wishes to proceed with surgery.    PMH: Past Medical History  Diagnosis Date  . Hypertension     PSH: History reviewed. No pertinent past surgical history.  Social History:  reports that she has never smoked. She does not have any smokeless tobacco history on file. She reports that she does not drink alcohol. Her drug history is not on file.  Allergies:  No Known Allergies  Medications: Current Facility-Administered Medications  Medication Dose Route Frequency Provider Last Rate Last Dose  . 0.9 %  sodium chloride infusion   Intravenous Continuous Francine Graven, DO 75 mL/hr at 07/02/14 1247    . HYDROmorphone (DILAUDID) injection 0.5 mg  0.5 mg Intravenous Q2H PRN Francine Graven, DO   0.5 mg at 07/02/14 1517   Current Outpatient Prescriptions  Medication Sig Dispense Refill  . amLODipine (NORVASC) 5 MG tablet Take 5 mg by mouth daily.    . pravastatin (PRAVACHOL) 20 MG tablet Take 20 mg by mouth daily.    . Vitamin D, Ergocalciferol, (DRISDOL) 50000 UNITS CAPS capsule Take 50,000 Units by mouth  every 7 (seven) days. Mondays      Results for orders placed or performed during the hospital encounter of 07/02/14 (from the past 48 hour(s))  CBC with Differential     Status: None   Collection Time: 07/02/14 12:20 PM  Result Value Ref Range   WBC 8.1 4.0 - 10.5 K/uL   RBC 4.12 3.87 - 5.11 MIL/uL   Hemoglobin 12.4 12.0 - 15.0 g/dL   HCT 37.4 36.0 - 46.0 %   MCV 90.8 78.0 - 100.0 fL   MCH 30.1 26.0 - 34.0 pg   MCHC 33.2 30.0 - 36.0 g/dL   RDW 13.2 11.5 - 15.5 %   Platelets 210 150 - 400 K/uL   Neutrophils Relative % 48 43 - 77 %   Neutro Abs 3.9 1.7 - 7.7 K/uL   Lymphocytes Relative 46 12 - 46 %   Lymphs Abs 3.7 0.7 - 4.0 K/uL   Monocytes Relative 5 3 - 12 %   Monocytes Absolute 0.4 0.1 - 1.0 K/uL   Eosinophils Relative 1 0 - 5 %   Eosinophils Absolute 0.1 0.0 - 0.7 K/uL   Basophils Relative 0 0 - 1 %   Basophils Absolute 0.0 0.0 - 0.1 K/uL  Protime-INR     Status: None   Collection Time: 07/02/14 12:20 PM  Result Value Ref Range   Prothrombin Time 12.3 11.6 - 15.2 seconds   INR 0.90  0.00 - 6.76  Basic metabolic panel     Status: Abnormal   Collection Time: 07/02/14 12:20 PM  Result Value Ref Range   Sodium 142 137 - 147 mEq/L   Potassium 3.3 (L) 3.7 - 5.3 mEq/L   Chloride 101 96 - 112 mEq/L   CO2 21 19 - 32 mEq/L   Glucose, Bld 111 (H) 70 - 99 mg/dL   BUN 13 6 - 23 mg/dL   Creatinine, Ser 0.48 (L) 0.50 - 1.10 mg/dL   Calcium 9.2 8.4 - 10.5 mg/dL   GFR calc non Af Amer >90 >90 mL/min   GFR calc Af Amer >90 >90 mL/min    Comment: (NOTE) The eGFR has been calculated using the CKD EPI equation. This calculation has not been validated in all clinical situations. eGFR's persistently <90 mL/min signify possible Chronic Kidney Disease.    Anion gap 20 (H) 5 - 15  I-stat Chem 8, ED     Status: Abnormal   Collection Time: 07/02/14 12:32 PM  Result Value Ref Range   Sodium 140 137 - 147 mEq/L   Potassium 2.9 (LL) 3.7 - 5.3 mEq/L   Chloride 102 96 - 112 mEq/L   BUN 12 6  - 23 mg/dL   Creatinine, Ser 0.50 0.50 - 1.10 mg/dL   Glucose, Bld 130 (H) 70 - 99 mg/dL   Calcium, Ion 1.06 (L) 1.13 - 1.30 mmol/L   TCO2 21 0 - 100 mmol/L   Hemoglobin 13.6 12.0 - 15.0 g/dL   HCT 40.0 36.0 - 46.0 %   Comment NOTIFIED PHYSICIAN    Dg Pelvis Portable  07/02/2014   CLINICAL DATA:  Patient fell approximately 3 feet  EXAM: PORTABLE PELVIS 1-2 VIEWS  COMPARISON:  None.  FINDINGS: There is overlying metallic hardware on the left. There is no apparent fracture or dislocation. There is mild symmetric narrowing of both hip joints. No erosive change.  IMPRESSION: No demonstrable fracture or dislocation.   Electronically Signed   By: Lowella Grip M.D.   On: 07/02/2014 13:01   Dg Chest Port 1 View  07/02/2014   CLINICAL DATA:  Patient fell approximately 3 feet  EXAM: PORTABLE CHEST - 1 VIEW  COMPARISON:  None.  FINDINGS: There is an area of calcification either in or overlying the right apex. Elsewhere lungs are clear. Heart is upper normal in size with pulmonary vascularity within normal limits. No pneumothorax. No adenopathy. No fractures are apparent. There is a small bone island in the proximal right humerus.  IMPRESSION: No edema or consolidation.  No apparent pneumothorax.   Electronically Signed   By: Lowella Grip M.D.   On: 07/02/2014 12:59   Dg Femur Left Port  07/02/2014   CLINICAL DATA:  Trauma.  Fall from a 3 foot height.  Hypertension.  EXAM: PORTABLE LEFT FEMUR - 2 VIEW  COMPARISON:  None.  FINDINGS: Comminuted displaced fracture of the midshaft of the femur noted with a intermediary butterfly fragment displaced posteriorly 1.7 cm from the proximal fragment, and with the distal fragment posteriorly displaced about 3 cm from the proximal fragment. 11 degrees of angulation between the main proximal fragment in the main distal fragment.  IMPRESSION: 1. Mildly comminuted midshaft femur fracture, moderately displaced as detailed above.   Electronically Signed   By: Sherryl Barters M.D.   On: 07/02/2014 13:01    Review of Systems  Constitutional: Negative.   HENT: Negative.   Eyes: Negative.   Respiratory: Negative.   Cardiovascular:  Negative.   Gastrointestinal: Negative.   Genitourinary: Negative.   Musculoskeletal: Positive for joint pain and falls.  Skin: Negative.   Neurological: Negative.   Endo/Heme/Allergies: Negative.   Psychiatric/Behavioral: Negative.      Physical Exam  Constitutional: She is oriented to person, place, and time. She appears well-developed and well-nourished.  HENT:  Head: Normocephalic and atraumatic.  Eyes: Pupils are equal, round, and reactive to light.  Neck: Neck supple. No JVD present. No tracheal deviation present. No thyromegaly present.  Cardiovascular: Normal rate and intact distal pulses.   Respiratory: Effort normal and breath sounds normal. No respiratory distress. She has no wheezes.  GI: Soft. There is no tenderness. There is no guarding.  Musculoskeletal:       Left upper leg: She exhibits tenderness, bony tenderness and swelling. She exhibits no edema and no laceration.       Left foot: There is normal capillary refill.  Lymphadenopathy:    She has no cervical adenopathy.  Neurological: She is alert and oriented to person, place, and time. No sensory deficit.  Skin: Skin is warm and dry.        Assessment/Plan Assessment:      Left comminuted displaced fracture of the midshaft of the femur   Plan:  NPO now  Patient will undergo an ORIF of the left femur fracture per Dr. Alvan Dame at [facility].   Consent has been ordered.  Risks benefits and expectations were discussed with the patient and family member. Patient and family member understand risks, benefits and expectations and wishes to proceed.   [place] Pugh Cheresa Siers   PA-C  07/02/2014, 4:00 PM

## 2014-07-03 ENCOUNTER — Encounter (HOSPITAL_COMMUNITY): Payer: Self-pay | Admitting: General Practice

## 2014-07-03 DIAGNOSIS — D500 Iron deficiency anemia secondary to blood loss (chronic): Secondary | ICD-10-CM | POA: Diagnosis not present

## 2014-07-03 LAB — CBC
HEMATOCRIT: 26.2 % — AB (ref 36.0–46.0)
Hemoglobin: 8.9 g/dL — ABNORMAL LOW (ref 12.0–15.0)
MCH: 30 pg (ref 26.0–34.0)
MCHC: 34 g/dL (ref 30.0–36.0)
MCV: 88.2 fL (ref 78.0–100.0)
Platelets: 175 10*3/uL (ref 150–400)
RBC: 2.97 MIL/uL — AB (ref 3.87–5.11)
RDW: 13.2 % (ref 11.5–15.5)
WBC: 6.1 10*3/uL (ref 4.0–10.5)

## 2014-07-03 LAB — BASIC METABOLIC PANEL
Anion gap: 11 (ref 5–15)
BUN: 12 mg/dL (ref 6–23)
CALCIUM: 7.8 mg/dL — AB (ref 8.4–10.5)
CHLORIDE: 101 meq/L (ref 96–112)
CO2: 26 meq/L (ref 19–32)
CREATININE: 0.51 mg/dL (ref 0.50–1.10)
GFR calc Af Amer: >90 mL/min (ref >90)
GFR calc non Af Amer: >90 mL/min (ref >90)
Glucose, Bld: 133 mg/dL — ABNORMAL HIGH (ref 70–99)
Potassium: 3.9 mEq/L (ref 3.7–5.3)
Sodium: 138 mEq/L (ref 137–147)

## 2014-07-03 NOTE — Evaluation (Addendum)
Physical Therapy Evaluation Patient Details Name: [name] MRN: [id number] DOB: [birth year] Today's Date: 07/03/2014   History of Present Illness  Pt. is a [place] woman who fell on 07/02/14, sustaining left femur fx (midshaft).  She underwent ORIF .  PMH of HTN  Clinical Impression  Pt's daughter in law [name] present and interpreting during session. Signed form in shadow chart noted.   Pt. Presents s/p ORIF for mid shaft femur fx from fall.  Will benefit from acute PT to address her decreased mobility and gait issues.  I believe pt. Would also  benefit from a CIR consult and have requested a pre-screen to be completed by admissions coordinator. Pt. 's family support appears strong but believe it will be more feasible for family to manage pt. If she can improve her functional status.      Follow Up Recommendations CIR    Equipment Recommendations  Rolling walker with 5" wheels;3in1 (PT)    Recommendations for Other Services Rehab consult     Precautions / Restrictions Precautions Precautions: Fall Restrictions Weight Bearing Restrictions: Yes LLE Weight Bearing: Partial weight bearing LLE Partial Weight Bearing Percentage or Pounds: 50% PWB      Mobility  Bed Mobility Overal bed mobility: Needs Assistance;+2 for physical assistance Bed Mobility: Supine to Sit     Supine to sit: +2 for physical assistance;Mod assist     General bed mobility comments: Pt. indicated to her daughter in law that she was afraid.  After careful explanation to daughter in llaw who interpreted to pt., she was assisted to EOB with +2  mod assist at hips and shoulders.  Feel pt. was somewhat limited in her ability to her fear  Transfers Overall transfer level: Needs assistance Equipment used: None;Rolling walker (2 wheeled) Transfers: Sit to/from [employer] CorporationStand;Stand Pivot Transfers Sit to Stand: +2 physical assistance;Mod assist Stand pivot transfers: +2 physical assistance;Mod assist       General  transfer comment: Pt. indicated she needed to use the bathroom.  Pt. assisted to 3 n 1 with +2 mod assist for stand pivot transfer then with RW to recliner with same assist level.  Pt. needed support under L foot to assure PWB status.  Pt. safely positioned in recliner chair following transfer  Ambulation/Gait Ambulation/Gait assistance:  (TBA)              Stairs            Wheelchair Mobility    Modified Rankin (Stroke Patients Only)       Balance Overall balance assessment: Needs assistance Sitting-balance support: No upper extremity supported;Feet supported Sitting balance-Leahy Scale: Fair     Standing balance support: Bilateral upper extremity supported;During functional activity Standing balance-Leahy Scale: Poor Standing balance comment: needs external support for upright                             Pertinent Vitals/Pain Pain Assessment: 0-10 Pain Score: 8  Pain Location: left leg Pain Descriptors / Indicators: Aching;Numbness (numbness both calves) Pain Intervention(s): Limited activity within patient's tolerance    Home Living Family/patient expects to be discharged to:: Private residence Living Arrangements: Children;Spouse/significant other Available Help at Discharge: Family;Available 24 hours/day (grandson) Type of Home: House Home Access: Stairs to enter Entrance Stairs-Rails: Right Entrance Stairs-Number of Steps: 2 Home Layout: Two level Home Equipment: None Additional Comments: daughter in law states pt. 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     Higher education careers adviserHand Dominance   Dominant Hand: Right    Extremity/Trunk Assessment   Upper Extremity Assessment: Defer to OT evaluation           Lower Extremity Assessment: LLE deficits/detail   LLE Deficits / Details: able to ankle pump and quad set;  limited due to post op status and pain  Cervical / Trunk Assessment: Normal  Communication   Communication: Prefers language other than English;Other (comment) ([place]  Cognition Arousal/Alertness: Awake/alert Behavior During Therapy: WFL for tasks assessed/performed Overall Cognitive Status: Within Functional Limits for tasks assessed                      General Comments      Exercises General Exercises - Lower Extremity Ankle Circles/Pumps: AROM;Both;10 reps;Supine Quad Sets: AROM;Both;10 reps;Supine      Assessment/Plan    PT Assessment Patient needs continued PT services  PT Diagnosis Difficulty walking;Acute pain   PT Problem List Decreased strength;Decreased activity tolerance;Decreased balance;Decreased mobility;Decreased knowledge of use of DME;Decreased knowledge of precautions;Pain  PT Treatment Interventions DME instruction;Gait training;Stair training;Functional mobility training;Therapeutic activities;Therapeutic exercise;Balance training;Patient/family education   PT Goals (Current goals can be found in the Care Plan section) Acute Rehab PT Goals Patient Stated Goal: rehab then home PT Goal Formulation: With patient/family Time For Goal Achievement: 07/10/14 Potential to Achieve Goals: Good    Frequency Min 6X/week   Barriers to discharge        Co-evaluation               End of Session Equipment Utilized During Treatment: Gait belt Activity Tolerance: Patient limited by pain;Patient limited by fatigue Patient left: in chair;with call bell/phone within reach;with family/visitor present;Other (comment) (daughter in Careers information officerlaw [name]) Nurse Communication: Mobility status;Patient requests pain meds;Precautions;Weight bearing status         Time: 1142-1206 PT Time Calculation (min) (ACUTE ONLY): 24 min   Charges:   PT Evaluation $Initial PT Evaluation Tier I: 1 Procedure PT Treatments $Therapeutic Activity: 8-22 mins   PT G Codes:           Ferman HammingBlankenship, Diedre Maclellan B 07/03/2014, 2:17 PM Weldon PickingSusan Matin Mattioli PT Acute Rehab Services [phone] Beeper [phone]

## 2014-07-03 NOTE — Progress Notes (Signed)
Orthopedic Tech Progress Note Patient Details:  [name] 05-31-1946 [id number] No OHF available at this time. When frame becomes available patient will receive one.  Patient ID: Karla Dakinau Rando, female   DOB: 05-31-1946, 68 y.o.   MRN: [id number]   Orie Routsia R Thompson 07/03/2014, 9:35 AM

## 2014-07-03 NOTE — Progress Notes (Signed)
Discussed with Chong SicilianJill Lauer of Dr. Nilsa Nuttinglin's office. We will consult for potential inpt rehab admission and assess if inpt rehab stay needed before return home with home health and family assist or if pt and family feel direct d/c home most appropriate. [phone]

## 2014-07-03 NOTE — Progress Notes (Signed)
Utilization review completed.  

## 2014-07-03 NOTE — Care Management Note (Signed)
CARE MANAGEMENT NOTE 07/03/2014  Patient:  Orlie DakinDOAN,Kameryn   Account Number:  [id number]  Date Initiated:  07/03/2014  Documentation initiated by:  Vance PeperBRADY,Sigrid Schwebach  Subjective/Objective Assessment:   68 yr old female admitted with a left femur fracture, s/p fall. Patient had ORIF of the left femur.     Action/Plan:   Physical therapist is reccommending CIR to eval patient. Case manager will continue to monitor.   Anticipated DC Date:     Anticipated DC Plan:        DC Planning Services  CM consult      Choice offered to / List presented to:             Status of service:  In process, will continue to follow Medicare Important Message given?   (If response is "NO", the following Medicare IM given date fields will be blank) Date Medicare IM given:   Medicare IM given by:   Date Additional Medicare IM given:   Additional Medicare IM given by:    Discharge Disposition:    Per UR Regulation:  Reviewed for med. necessity/level of care/duration of stay

## 2014-07-03 NOTE — Op Note (Signed)
[place]:  Wong, Tylesha                    ACCOUNT NO.:  [id number]  MEDICAL RECORD NO.:  [id number]  LOCATION:  5N07C                        FACILITY:  [facility]  PHYSICIAN:  Madlyn FrankelMatthew D. Charlann Boxerlin, M.D.  DATE OF BIRTH:  [birth year]  DATE OF PROCEDURE:  07/02/2014 DATE OF DISCHARGE:                              OPERATIVE REPORT   PREOPERATIVE DIAGNOSIS:  Closed midshaft comminuted left femur fracture, mid shaft region.  POSTOPERATIVE DIAGNOSIS:  Closed midshaft comminuted left femur fracture, mid shaft region.  PROCEDURE:  Open reduction and internal fixation of the left mid shaft femur fracture utilizing a Biomet VersaNail 9 x 34 cm with a proximal distal static interlock.  SURGEON:  Madlyn FrankelMatthew D. Charlann Boxerlin, M.D.  ASSISTANT:  Lanney GinsMatthew Babish, PA-C.  Note that, Mr. Carmon SailsBabish was present for the entire case from preoperative positioning, management, and facilitation of fracture reduction while passing instrumentation as well as general facilitation of the case, and primary wound closure.  ANESTHESIA:  General.  SPECIMENS:  None.  COMPLICATION:  None.  BLOOD LOSS:  Probably about 100 mL.  INDICATIONS FOR PROCEDURE:  Ms. [name] is a 68 year old female, who was here from [place] visiting family when she fell off a stepladder.  She landed directly on her left leg and had immediate onset of pain, deformity, inability to bear weight.  She was subsequently transferred to emergency room where radiographs confirmed and concerned with suspicion of femur fracture based on deformity.  Upon radiographic assessment in the emergency room, Orthopedics was consulted for admission and management based on the isolated medical conditions and no other comorbidities.  She had a history of hypertension only.  She is in otherwise good health prior to this fall.  Risks, benefits, and necessity of the procedure were discussed with the patient through her stepdaughter who acts as an interpreter as Ms. [name] is [place].   Consent was obtained after reviewing these and assess the fracture.  Postoperative course was briefly discussed as well.  She probably will require follow up in [place] where she is from.  PROCEDURE IN DETAIL:  The patient was brought to operative theater. Once adequate anesthesia, preoperative antibiotics, Ancef administered, she was positioned supine on the fracture table.  Her right unaffected extremity was flexed and abducted out of way with bony prominences padded particularly on the peroneal nerve.  The left foot was placed in traction boot.  With a well-padded perineal post, traction was applied to obtain length to confirm radiographically by fluoroscopy.  At this point, the left lower extremity was prepped and draped in sterile fashion using shower curtain technique.  A time-out was performed identifying the patient, planned procedure, and extremity.  Fluoroscopy was brought back to the field and at the proximal aspect of the hip.  The tip of the trochanter identified and incision made laterally proximal trochanter.  The guidewire was then inserted into the tip of the trochanter for routine.  Based on attempts at reduction at the fracture site, I ultimately had to make a 3-inch incision at the fracture site and confirmed radiographically and was then able to digitally maintain reduction of the fracture while the guidewire was passed.  This was confirmed  radiographically in the AP and lateral planes.  Once this was passed, we measured the depth and selected a 34- cm nail.  I then began reaming with the 8-mm reamer, reamed up to a 10- 1/2 mm reamer and selected a 9-mm diameter nail.  The intramedullary nail was then passed by hand to the fracture site and then beyond it and then oriented in its appropriate depth at the proximal aspect of the hip.  I selected to use a proximal static locking from the greater lesser trochanter.  This was drilled, measured, and placed a  60-mm screw.  Distally, then under perfect circle technique, upon releasing the traction and externally rotate her leg about 5 degrees to prevent any malposition of her lower extremity.  I then placed under perfect circle technique a distal static interlock.  This was done in the dynamic screw slot but placed into the static slot.  At this point, final radiographs were obtained in AP and lateral planes. We irrigated all wounds, the proximal 2 wounds, the gluteal fascia was reapproximated as well as the iliotibial band at the mid portion of the fracture site.  All remaining portion of the wounds were closed with 2-0 Vicryl and running 4-0 Monocryl.  Then, cleaned, dried, and dressed sterilely using Dermabond and Mepilex dressings.  She was then extubated and brought to the recovery room in stable condition tolerating the procedure well.  We will make her be partial weightbearing.  We will keep her in the hospital and determine discharge plan based on family needs and her desire to relocate back to [place].     Madlyn FrankelMatthew D. Charlann Boxerlin, M.D.     MDO/MEDQ  D:  07/02/2014  T:  07/03/2014  Job:  [id number]

## 2014-07-03 NOTE — Progress Notes (Signed)
     Subjective: 1 Day Post-Op Procedure(s) (LRB): ORIF FEMORAL NAIL (Left)   Daughter is translating. Patient reports pain as mild, pain controlled. States that there was significant pain after waking up from surgery, but much better today.  No events throughout the night.   Objective:   VITALS:   Filed Vitals:   07/03/14 0615  BP: 120/54  Pulse: 84  Temp: 98 F (36.7 C)  Resp: 16    Dorsiflexion/Plantar flexion intact Incision: dressing C/D/I No cellulitis present Compartment soft  LABS  Recent Labs  07/02/14 1220 07/02/14 1232 07/03/14 0519  HGB 12.4 13.6 8.9*  HCT 37.4 40.0 26.2*  WBC 8.1  --  6.1  PLT 210  --  175     Recent Labs  07/02/14 1220 07/02/14 1232 07/03/14 0519  NA 142 140 138  K 3.3* 2.9* 3.9  BUN 13 12 12   CREATININE 0.48* 0.50 0.51  GLUCOSE 111* 130* 133*     Assessment/Plan: 1 Day Post-Op Procedure(s) (LRB): ORIF FEMORAL NAIL (Left)   Advance diet Up with therapy D/C IV fluids Discharge home with home health eventually, when ready. Potassium numbers are better this morning.   Expected ABLA  Treated with iron and will observe      [name]   PAC  07/03/2014, 8:19 AM

## 2014-07-03 NOTE — Progress Notes (Signed)
Rehab Admissions Coordinator Note:  Patient was screened by Trish MageLogue, Demisha Nokes M for appropriateness for an Inpatient Acute Rehab Consult.  At this time, we are recommending Inpatient Rehab consult.  Trish MageLogue, Tamarion Haymond M 07/03/2014, 2:51 PM  I can be reached at [phone].

## 2014-07-04 ENCOUNTER — Encounter (HOSPITAL_COMMUNITY): Payer: Self-pay | Admitting: Physical Medicine and Rehabilitation

## 2014-07-04 LAB — ABO/RH: ABO/RH(D): A POS

## 2014-07-04 LAB — BASIC METABOLIC PANEL
Anion gap: 9 (ref 5–15)
BUN: 10 mg/dL (ref 6–23)
CALCIUM: 7.4 mg/dL — AB (ref 8.4–10.5)
CO2: 27 mEq/L (ref 19–32)
CREATININE: 0.53 mg/dL (ref 0.50–1.10)
Chloride: 108 mEq/L (ref 96–112)
GFR calc Af Amer: >90 mL/min (ref >90)
Glucose, Bld: 99 mg/dL (ref 70–99)
POTASSIUM: 3.5 meq/L — AB (ref 3.7–5.3)
Sodium: 144 mEq/L (ref 137–147)

## 2014-07-04 LAB — CBC
HEMATOCRIT: 22.5 % — AB (ref 36.0–46.0)
Hemoglobin: 7.2 g/dL — ABNORMAL LOW (ref 12.0–15.0)
MCH: 29.4 pg (ref 26.0–34.0)
MCHC: 32 g/dL (ref 30.0–36.0)
MCV: 91.8 fL (ref 78.0–100.0)
Platelets: 145 10*3/uL — ABNORMAL LOW (ref 150–400)
RBC: 2.45 MIL/uL — ABNORMAL LOW (ref 3.87–5.11)
RDW: 13.5 % (ref 11.5–15.5)
WBC: 5.5 10*3/uL (ref 4.0–10.5)

## 2014-07-04 LAB — PREPARE RBC (CROSSMATCH)

## 2014-07-04 MED ORDER — SODIUM CHLORIDE 0.9 % IV SOLN: Freq: Once | INTRAVENOUS | Status: DC

## 2014-07-04 NOTE — Plan of Care (Signed)
Problem: Consults Goal: Hip/Femur Fracture Patient Education See Patient Education Module for education specifics. Outcome: Completed/Met Date Met:  07/04/14 Goal: Skin Care Protocol Initiated - if Braden Score 18 or less If consults are not indicated, leave blank or document N/A Outcome: Completed/Met Date Met:  07/04/14 Goal: Nutrition Consult-if indicated Outcome: Completed/Met Date Met:  07/04/14 Goal: Diabetes Guidelines if Diabetic/Glucose > 140 If diabetic or lab glucose is > 140 mg/dl - Initiate Diabetes/Hyperglycemia Guidelines & Document Interventions  Outcome: Completed/Met Date Met:  07/04/14  Problem: Phase I Progression Outcomes Goal: Pre op pain controlled with appropriate interventions Outcome: Completed/Met Date Met:  07/04/14 Goal: Pre op Medical MD consult, if indicated Outcome: Completed/Met Date Met:  07/04/14 Goal: Pre op labs/procedures/consults per MD order Outcome: Completed/Met Date Met:  07/04/14 Goal: Pre op Protime within normal limits Outcome: Completed/Met Date Met:  07/04/14 Goal: Pre op NPO per MD orders Outcome: Completed/Met Date Met:  07/04/14 Goal: Pre op-initial discharge plan identified Outcome: Completed/Met Date Met:  07/04/14 Goal: Pre op hemodynamically stable Outcome: Completed/Met Date Met:  07/04/14 Goal: Post op CMS/Neurovascular status WDL Outcome: Completed/Met Date Met:  07/04/14 Goal: Post op clear liquids, advance diet as tolerated Outcome: Completed/Met Date Met:  07/04/14

## 2014-07-04 NOTE — Progress Notes (Signed)
OT Cancellation Note  Patient Details Name: [name] MRN: [id number] DOB: Apr 01, 1946   Cancelled Treatment:    Reason Eval/Treat Not Completed: Medical issues which prohibited therapy . Pt with Hgb of 7.2 and awaiting blood transfusion. OT will follow up as available when appropriate to complete evaluation.    [name], [name]   [name] [name], OTR/L Occupational Therapist [phone] (pager)  07/04/2014, 1:14 PM

## 2014-07-04 NOTE — Progress Notes (Signed)
Called blood bank blood not ready to give at this time

## 2014-07-04 NOTE — Plan of Care (Signed)
Problem: Phase II Progression Outcomes Goal: Bed to chair Outcome: Completed/Met Date Met:  07/04/14

## 2014-07-04 NOTE — Progress Notes (Signed)
     Subjective: 2 Days Post-Op Procedure(s) (LRB): ORIF FEMORAL NAIL (Left)   Seen by Dr. Charlann Boxerlin. Patient reports pain as mild, pain controlled.  No events throughout the night. Progressing slowly with PT.  Objective:   VITALS:   Filed Vitals:   07/04/14 0545  BP: 114/47  Pulse: 81  Temp: 98.5 F (36.9 C)  Resp: 16    Dorsiflexion/Plantar flexion intact Incision: dressing C/D/I No cellulitis present Compartment soft Some mid thigh swelling   LABS  Recent Labs  07/02/14 1220 07/02/14 1232 07/03/14 0519 07/04/14 0437  HGB 12.4 13.6 8.9* 7.2*  HCT 37.4 40.0 26.2* 22.5*  WBC 8.1  --  6.1 5.5  PLT 210  --  175 145*     Recent Labs  07/02/14 1232 07/03/14 0519 07/04/14 0437  NA 140 138 144  K 2.9* 3.9 3.5*  BUN 12 12 10   CREATININE 0.50 0.51 0.53  GLUCOSE 130* 133* 99     Assessment/Plan: 2 Days Post-Op Procedure(s) (LRB): ORIF FEMORAL NAIL (Left) To receive one unit of blood today, for symptomatic anemia. Up with therapy Discharge to SNF when ready. Social Worker will need to figure out with the family if she is unable able to return home, if she will be placed here or if she needs [place] in a SN[place] in [place]   Karla AuerbachMatthew S. [name]   PAC  07/04/2014, 9:14 AM

## 2014-07-04 NOTE — Consult Note (Signed)
Physical Medicine and Rehabilitation Consult  Reason for Consult: Left communited displaced fracture of the midshaft of the femur Referring Physician:  Dr. Charlann Boxerlin    HPI: [name] is a 68 y.o. [place] female with history of HTN, hyperlipidemia; who was getting off the ladder when she fell backward with immediate onset of left leg pain. She was evaluated in ED 07/02/14 and was found to have left communited displaced fracture of the midshaft of the femur. She was evaluated by Dr. Charlann Boxerlin and underwent ORIF left mid shaft femur fracture. Post op PWB LLE and on lovenox for DVT prophylaxis. PT evaluation done yesterday and patient noted to have difficulty maintaining WB precautions as well as fear limiting mobility. MD and PT recommending CIR for follow up therapy.    Review of Systems  HENT: Negative for hearing loss.   Eyes: Negative for blurred vision and double vision.  Respiratory: Negative for cough and shortness of breath.   Cardiovascular: Negative for chest pain and palpitations.  Gastrointestinal: Positive for constipation (No BM since admissiton. ). Negative for heartburn, nausea and abdominal pain.  Musculoskeletal: Positive for joint pain. Negative for myalgias.  Neurological: Negative for dizziness, tingling and headaches.     Past Medical History  Diagnosis Date  . Hypertension   . Hyperlipidemia    Past Surgical History  Procedure Laterality Date  . Orif femur fracture Left 07/02/2014    IM NAIL  . Femur im nail Left 07/02/2014    Procedure: ORIF FEMORAL NAIL;  Surgeon: Shelda PalMatthew D Olin, MD;  [facility];  Service: Orthopedics;  Laterality: Left;    Family History  Problem Relation Age of Onset  . Hypertension Father       Social History:  Lives with daughter and family. Per reports that she has never smoked. She has never used smokeless tobacco. Per reports that she does not drink alcohol or use illicit drugs.    Allergies: No Known Allergies     Medications Prior to Admission  Medication Sig Dispense Refill  . amLODipine (NORVASC) 5 MG tablet Take 5 mg by mouth daily.    . pravastatin (PRAVACHOL) 20 MG tablet Take 20 mg by mouth daily.    . Vitamin D, Ergocalciferol, (DRISDOL) 50000 UNITS CAPS capsule Take 50,000 Units by mouth every 7 (seven) days. Mondays      Home: Home Living Family/patient expects to be discharged to:: Private residence Living Arrangements: Children, Spouse/significant other Available Help at Discharge: Family, Available 24 hours/day (grandson) Type of Home: House Home Access: Stairs to enter Secretary/administratorntrance Stairs-Number of Steps: 2 Entrance Stairs-Rails: Right Home Layout: Two level Alternate Level Stairs-Number of Steps: 10-12 Alternate Level Stairs-Rails: Right Home Equipment: None Additional Comments: daughter in law states pt. 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 indicated to her daughter in law that she was afraid.  After careful explanation to daughter in llaw who interpreted to pt., she was assisted to EOB with +2  mod assist at hips and shoulders.  Feel pt. was somewhat limited in her ability to her fear Transfers Overall transfer level: Needs assistance Equipment used: None, Rolling walker (2 wheeled) Transfers: Sit to/from Stand, Stand  Pivot Transfers Sit to Stand: +2 physical assistance, Mod assist Stand pivot transfers: +2 physical assistance, Mod assist General transfer comment: Pt. indicated she needed to use the bathroom.  Pt. assisted to 3 n 1 with +2 mod assist for stand pivot transfer then with RW to recliner with  same assist level.  Pt. needed support under L foot to assure PWB status.  Pt. safely positioned in recliner chair following transfer Ambulation/Gait Ambulation/Gait assistance:  (TBA)    ADL:    Cognition: Cognition Overall Cognitive Status: Within Functional Limits for tasks assessed Orientation Level: Oriented to situation, Oriented to person, Oriented to place Cognition Arousal/Alertness: Awake/alert Behavior During Therapy: [facility] for tasks assessed/performed Overall Cognitive Status: Within Functional Limits for tasks assessed  Blood pressure 114/47, pulse 81, temperature 98.5 F (36.9 C), resp. rate 16, height 5\' 2"  (1.575 m), weight 40.824 kg (90 lb), SpO2 100 %. Physical Exam  Nursing note and vitals reviewed. Constitutional: She appears well-developed.  Thin female. Grandson in room assisted with translation.   HENT:  Head: Normocephalic and atraumatic.  Eyes: Conjunctivae are normal. Pupils are equal, round, and reactive to light.  Neck: Normal range of motion. Neck supple.  Cardiovascular: Normal rate and regular rhythm.   Respiratory: Effort normal and breath sounds normal. No respiratory distress. She has no wheezes.  GI: Bowel sounds are normal. She exhibits no distension. There is no tenderness.  Musculoskeletal:  Moves BUE and RLE without difficulty. Pain with attempts at ROM left knee. PF/DF intact bilaterally.   Neurological: She is alert.  Pleasant and appropriate. Followed commands without difficulty.   Skin: Skin is warm and dry.  Moderate edema left thigh. Surgical dressings in place.   Psychiatric: She has a normal mood and affect. Her speech is normal and behavior is normal.    Results for orders placed or performed during the hospital encounter of 07/02/14 (from the past 24 hour(s))  CBC     Status: Abnormal   Collection Time: 07/04/14  4:37 AM  Result Value Ref Range   WBC 5.5 4.0 - 10.5 K/uL   RBC 2.45 (L) 3.87 - 5.11 MIL/uL   Hemoglobin 7.2 (L)  12.0 - 15.0 g/dL   HCT 16.1 (L) 09.6 - 04.5 %   MCV 91.8 78.0 - 100.0 fL   MCH 29.4 26.0 - 34.0 pg   MCHC 32.0 30.0 - 36.0 g/dL   RDW 40.9 81.1 - 91.4 %   Platelets 145 (L) 150 - 400 K/uL  Basic metabolic panel     Status: Abnormal   Collection Time: 07/04/14  4:37 AM  Result Value Ref Range   Sodium 144 137 - 147 mEq/L   Potassium 3.5 (L) 3.7 - 5.3 mEq/L   Chloride 108 96 - 112 mEq/L   CO2 27 19 - 32 mEq/L   Glucose, Bld 99 70 - 99 mg/dL   BUN 10 6 - 23 mg/dL   Creatinine, Ser 7.82 0.50 - 1.10 mg/dL   Calcium 7.4 (L) 8.4 - 10.5 mg/dL   GFR calc non Af Amer >90 >90 mL/min   GFR calc Af Amer >90 >90 mL/min   Anion gap 9 5 - 15   Dg Femur Left  07/02/2014   CLINICAL DATA:  ORIF of a left femur fracture  EXAM: LEFT FEMUR - 2 VIEW; DG C-ARM 61-120 MIN  COMPARISON:  Radiography from earlier the same day  FINDINGS: Fluoroscopy shows placement of an antegrade intra medullary femoral nail for fixation of a comminuted  diaphysis fracture. There is anatomic alignment of the diaphysis, with similar displacement of a isolated posterior cortex fragment. No new fracture.  IMPRESSION: Left femur diaphysis fracture ORIF.   Electronically Signed   By: Tiburcio PeaJonathan  Watts M.D.   On: 07/02/2014 19:08   Dg Pelvis Portable  07/02/2014   CLINICAL DATA:  Patient fell approximately 3 feet  EXAM: PORTABLE PELVIS 1-2 VIEWS  COMPARISON:  None.  FINDINGS: There is overlying metallic hardware on the left. There is no apparent fracture or dislocation. There is mild symmetric narrowing of both hip joints. No erosive change.  IMPRESSION: No demonstrable fracture or dislocation.   Electronically Signed   By: Bretta BangWilliam  Woodruff M.D.   On: 07/02/2014 13:01   Dg Chest Port 1 View  07/02/2014   CLINICAL DATA:  Patient fell approximately 3 feet  EXAM: PORTABLE CHEST - 1 VIEW  COMPARISON:  None.  FINDINGS: There is an area of calcification either in or overlying the right apex. Elsewhere lungs are clear. Heart is upper normal in  size with pulmonary vascularity within normal limits. No pneumothorax. No adenopathy. No fractures are apparent. There is a small bone island in the proximal right humerus.  IMPRESSION: No edema or consolidation.  No apparent pneumothorax.   Electronically Signed   By: Bretta BangWilliam  Woodruff M.D.   On: 07/02/2014 12:59   Dg Femur Left Port  07/02/2014   CLINICAL DATA:  Trauma.  Fall from a 3 foot height.  Hypertension.  EXAM: PORTABLE LEFT FEMUR - 2 VIEW  COMPARISON:  None.  FINDINGS: Comminuted displaced fracture of the midshaft of the femur noted with a intermediary butterfly fragment displaced posteriorly 1.7 cm from the proximal fragment, and with the distal fragment posteriorly displaced about 3 cm from the proximal fragment. 11 degrees of angulation between the main proximal fragment in the main distal fragment.  IMPRESSION: 1. Mildly comminuted midshaft femur fracture, moderately displaced as detailed above.   Electronically Signed   By: Herbie BaltimoreWalt  Liebkemann M.D.   On: 07/02/2014 13:01   Dg C-arm 61-120 Min  07/02/2014   CLINICAL DATA:  ORIF of a left femur fracture  EXAM: LEFT FEMUR - 2 VIEW; DG C-ARM 61-120 MIN  COMPARISON:  Radiography from earlier the same day  FINDINGS: Fluoroscopy shows placement of an antegrade intra medullary femoral nail for fixation of a comminuted diaphysis fracture. There is anatomic alignment of the diaphysis, with similar displacement of a isolated posterior cortex fragment. No new fracture.  IMPRESSION: Left femur diaphysis fracture ORIF.   Electronically Signed   By: Tiburcio PeaJonathan  Watts M.D.   On: 07/02/2014 19:08    Assessment/Plan: Diagnosis: left mid shaft femur fracture 1. Does the need for close, 24 hr/day medical supervision in concert with the patient's rehab needs make it unreasonable for this patient to be served in a less intensive setting? Yes 2. Co-Morbidities requiring supervision/potential complications: abla 3. Due to bladder management, bowel management,  safety, skin/wound care, disease management, medication administration, pain management and patient education, does the patient require 24 hr/day rehab nursing? Potentially 4. Does the patient require coordinated care of a physician, rehab nurse, PT (1-2 hrs/day, 5 days/week) and OT (1-2 hrs/day, 5 days/week) to address physical and functional deficits in the context of the above medical diagnosis(es)? Yes and Potentially Addressing deficits in the following areas: balance, endurance, locomotion, strength, transferring, bowel/bladder control, bathing, dressing, feeding, grooming, toileting, swallowing and psychosocial support 5. Can the patient actively participate in an intensive therapy program of at least  3 hrs of therapy per day at least 5 days per week? Potentially 6. The potential for patient to make measurable gains while on inpatient rehab is good 7. Anticipated functional outcomes upon discharge from inpatient rehab are modified independent  with PT, modified independent with OT, n/a with SLP. 8. Estimated rehab length of stay to reach the above functional goals is: 7 days? 9. Does the patient have adequate social supports and living environment to accommodate these discharge functional goals? Yes 10. Anticipated D/C setting: Home 11. Anticipated post D/C treatments: HH therapy 12. Overall Rehab/Functional Prognosis: excellent  RECOMMENDATIONS: This patient's condition is appropriate for continued rehabilitative care in the following setting: potentially CIR Patient has agreed to participate in recommended program. Yes Note that insurance prior authorization may be required for reimbursement for recommended care.  Comment: Rehab Admissions Coordinator to follow up.  Thanks,  Ranelle Oyster, MD, [place]     07/04/2014

## 2014-07-04 NOTE — Progress Notes (Addendum)
Physical Therapy Treatment Patient Details Name: [name] MRN: [id number] DOB: 04/05/1946 Today's Date: 07/04/2014    History of Present Illness Pt. is a [place] woman who fell on 07/02/14, sustaining left femur fx (midshaft).  She underwent ORIF .  PMH of HTN    PT Comments    Pt. Is progressing with PT despite low hgb and currently getting one unit of blood transfused.  Was less dizzy today as compared to yesterday and actually able to ambulate 8' with mod assist and second person for safety and to push recliner behind pt.  Karla DillingGrandson [name] in room with pt. And indicates he will be staying with her here on the unit for the most part while other family visiting at times.    Karla RedbirdKenny indicated that he and  his dad and mom live in same home as  patient.  He  says his dad [name]  (pt's son) should  be primary contact for discussing DC plans [phone]) as he has the best mastery of [place].  [name]'s phone # is [phone].  Will proceed with PT for increasing functional mobility and gait.  Pt. Still appears to be a good CIR candidate.    Follow Up Recommendations  CIR     Equipment Recommendations  Rolling walker with 5" wheels;3in1 (PT)    Recommendations for Other Services       Precautions / Restrictions Precautions Precautions: Fall Restrictions Weight Bearing Restrictions: Yes LLE Weight Bearing: Partial weight bearing LLE Partial Weight Bearing Percentage or Pounds: 50% PWB    Mobility  Bed Mobility Overal bed mobility: Needs Assistance;+2 for physical assistance Bed Mobility: Supine to Sit     Supine to sit: +2 for physical assistance;Mod assist     General bed mobility comments: Pt. grimaces with bed mobility tasks ; still needs +2 mod assist for moving toward EOB and to elevate shoulders ( use of bed pad to assist)  Transfers Overall transfer level: Needs assistance Equipment used: Rolling walker (2 wheeled) Transfers: Sit to/from Stand Sit to Stand: Mod assist;+2  physical assistance         General transfer comment: sit to stand with assist to power up and to stabilize while standing; pt. able to self limit to [facility] better today  Ambulation/Gait Ambulation/Gait assistance: Mod assist;+2 safety/equipment Ambulation Distance (Feet): 8 Feet Assistive device: Rolling walker (2 wheeled) Gait Pattern/deviations: Step-to pattern;Decreased step length - right;Decreased step length - left;Antalgic Gait velocity: decreased   General Gait Details: Pt. self limits to TDWB L LE.  With demonstration and vc's pt. able to increase WB through L LE .  Needed mod assist for balance and to maneuver RW.  Second person for safety and Haematologistrecliner chair   Stairs            Wheelchair Mobility    Modified Rankin (Stroke Patients Only)       Balance                                    Cognition Arousal/Alertness: Awake/alert Behavior During Therapy: WFL for tasks assessed/performed Overall Cognitive Status: Within Functional Limits for tasks assessed                      Exercises General Exercises - Lower Extremity Ankle Circles/Pumps: AROM;Both;10 reps;Supine Quad Sets: AROM;Both;10 reps;Supine Short Arc Quad: AAROM;Left;10 reps;Supine Hip ABduction/ADduction: AROM;Left;10 reps;Supine    General Comments  Pertinent Vitals/Pain Pain Assessment: 0-10 Pain Score: 5  Pain Location: left leg with mobility Pain Descriptors / Indicators: Aching Pain Intervention(s): Limited activity within patient's tolerance;Monitored during session;Repositioned    Home Living                      Prior Function            PT Goals (current goals can now be found in the care plan section) Progress towards PT goals: Progressing toward goals    Frequency  Min 6X/week    PT Plan Current plan remains appropriate    Co-evaluation             End of Session Equipment Utilized During Treatment: Gait  belt Activity Tolerance: Patient limited by pain;Patient limited by fatigue Patient left: in chair;with call bell/phone within reach;with family/visitor present     Time: [id number] PT Time Calculation (min) (ACUTE ONLY): 23 min  Charges:  $Gait Training: 8-22 mins $Therapeutic Exercise: 8-22 mins                    G Codes:      Ferman HammingBlankenship, Carlina Derks B 07/04/2014, 3:30 PM Weldon PickingSusan Quida Glasser PT Acute Rehab Services [phone] Bee[place] [phone]

## 2014-07-04 NOTE — Progress Notes (Addendum)
Rehab admissions - I am following pt's case and noted that rehab MD has stated that pt is a potential rehab candidate. I noted that pt's Hgb is 7.2 and pt awaiting blood transfusion. I noted that OT were not able to work with pt today.  Addendum: Later this afternoon, I received an update from [place], PT about pt's progress with therapy today noting that pt was able to ambulate 8'.  I met with pt and her grandson. Pt speaks [place] and grandson was agreeable to help with translation and deferred the use of an interpreter. I explained the possibility of inpatient rehab and pt stated she wanted to rehab back in [place], where she and her family live. Further questions were answered and informational brochures were given about our rehab program.  Further discussion was had describing the differences between acute rehab and rehab at a skilled nursing facility. Pt repeated that her first choice would be acute rehab in [place]. If that is not an option, then she would consider acute rehab at [facility]. She was not interested in pursuing SNF.  I updated Manuela Schwartz, case manager and we will follow up with pt on Monday.  We will consider a possible inpatient rehab admit pending pt's preference to come to [facility], her medical clearance and our bed availability on Monday.   Please call me with any questions. Thanks.  Nanetta Batty, PT Rehabilitation Admissions Coordinator [phone]

## 2014-07-04 NOTE — Plan of Care (Signed)
Problem: Phase II Progression Outcomes Goal: Tolerating diet Outcome: Completed/Met Date Met:  07/04/14     

## 2014-07-05 LAB — CBC
HEMATOCRIT: 27.8 % — AB (ref 36.0–46.0)
Hemoglobin: 9.4 g/dL — ABNORMAL LOW (ref 12.0–15.0)
MCH: 29.9 pg (ref 26.0–34.0)
MCHC: 33.8 g/dL (ref 30.0–36.0)
MCV: 88.5 fL (ref 78.0–100.0)
PLATELETS: 148 10*3/uL — AB (ref 150–400)
RBC: 3.14 MIL/uL — AB (ref 3.87–5.11)
RDW: 16.2 % — ABNORMAL HIGH (ref 11.5–15.5)
WBC: 6.5 10*3/uL (ref 4.0–10.5)

## 2014-07-05 LAB — BASIC METABOLIC PANEL
ANION GAP: 13 (ref 5–15)
BUN: 9 mg/dL (ref 6–23)
CO2: 26 meq/L (ref 19–32)
CREATININE: 0.45 mg/dL — AB (ref 0.50–1.10)
Calcium: 8 mg/dL — ABNORMAL LOW (ref 8.4–10.5)
Chloride: 104 mEq/L (ref 96–112)
GFR calc Af Amer: >90 mL/min (ref >90)
GFR calc non Af Amer: >90 mL/min (ref >90)
Glucose, Bld: 96 mg/dL (ref 70–99)
Potassium: 3.4 mEq/L — ABNORMAL LOW (ref 3.7–5.3)
Sodium: 143 mEq/L (ref 137–147)

## 2014-07-05 LAB — TYPE AND SCREEN
ABO/RH(D): A POS
Antibody Screen: NEGATIVE
UNIT DIVISION: 0

## 2014-07-05 NOTE — Progress Notes (Signed)
OT Cancellation Note  Patient Details Name: [name] MRN: [id number] DOB: [birth year]   Cancelled Treatment:    Reason Eval/Treat Not Completed: Other (comment) Pt is Medicare and current D/C plan is either SNF or CIR on Monday. No apparent immediate acute care OT needs, therefore will defer OT to SNF. If OT eval is needed please call Acute Rehab Dept. at [phone] or text page OT at [phone].     Evette GeorgesLeonard, Bayan Hedstrom Eva [phone] 07/05/2014, 11:33 AM

## 2014-07-05 NOTE — Progress Notes (Signed)
Subjective: 3 Days Post-Op Procedure(s) (LRB): ORIF FEMORAL NAIL (Left) Patient reports pain as well controlled.  Located to Left leg. Tolerating PO's. Reports improvement since transfusion. Denies SOB, CP, or Calf pain. Family at bedside.  Objective: Vital signs in last 24 hours: Temp:  [98.4 F (36.9 C)-99.7 F (37.6 C)] 98.5 F (36.9 C) (11/14 0537) Pulse Rate:  [75-83] 80 (11/14 0537) Resp:  [16] 16 (11/14 0537) BP: (97-138)/(39-60) 115/53 mmHg (11/14 0537) SpO2:  [100 %] 100 % (11/14 0537)  Intake/Output from previous day: 11/13 0701 - 11/14 0700 In: 1380 [P.O.:1080; Blood:300] Out: -  Intake/Output this shift:     Recent Labs  07/02/14 1220 07/02/14 1232 07/03/14 0519 07/04/14 0437 07/05/14 0655  HGB 12.4 13.6 8.9* 7.2* 9.4*    Recent Labs  07/04/14 0437 07/05/14 0655  WBC 5.5 6.5  RBC 2.45* 3.14*  HCT 22.5* 27.8*  PLT 145* 148*    Recent Labs  07/04/14 0437 07/05/14 0655  NA 144 143  K 3.5* 3.4*  CL 108 104  CO2 27 26  BUN 10 9  CREATININE 0.53 0.45*  GLUCOSE 99 96  CALCIUM 7.4* 8.0*    Recent Labs  07/02/14 1220  INR 0.90    Alert and oriented x3. RRR, Lungs clear, BS x4. Left Calf soft and non tender. L leg dressing C/D/I. No DVT signs. No signs of infection or compartment syndrome. LLE neurovascularly intact.  Assessment/Plan: 3 Days Post-Op Procedure(s) (LRB): ORIF FEMORAL NAIL (Left) Up with PT Continue current care Awaiting SNF [place], possibly [place]. More then likely Monday D/C.  Anemia post-op: Improved since transfusion, no complications. Continue to monitor   Jocie Meroney L 07/05/2014, 9:44 AM

## 2014-07-05 NOTE — Progress Notes (Signed)
Physical Therapy Treatment Patient Details Name: [name] MRN: [id number] DOB: Dec 10, 1945 Today's Date: 07/05/2014    History of Present Illness Pt. is a [place] woman who fell on 07/02/14, sustaining left femur fx (midshaft).  She underwent ORIF .  PMH of HTN    PT Comments    Pt is making great progress on gait and mobility with greater distance but limited wb.  Pt is uncomfortable and is being cued to increase to PWB.  Her current tolerance is 25% max with prompting but will hopefully be able to get this increased with CIR.  Follow Up Recommendations  CIR     Equipment Recommendations  Rolling walker with 5" wheels;3in1 (PT)    Recommendations for Other Services Rehab consult     Precautions / Restrictions Precautions Precautions: Fall Restrictions Weight Bearing Restrictions: Yes LLE Weight Bearing: Partial weight bearing LLE Partial Weight Bearing Percentage or Pounds: 50% PWB    Mobility  Bed Mobility Overal bed mobility: Needs Assistance;+2 for physical assistance                Transfers Overall transfer level: Needs assistance Equipment used: Rolling walker (2 wheeled) Transfers: Sit to/from BJ'sStand;Stand Pivot Transfers Sit to Stand: Min assist;+2 safety/equipment Stand pivot transfers: Min assist       General transfer comment: cues for hand placement  Ambulation/Gait Ambulation/Gait assistance: Min assist Ambulation Distance (Feet): 15 Feet Assistive device: Rolling walker (2 wheeled) Gait Pattern/deviations: Step-to pattern;Decreased step length - right;Decreased stance time - left;Decreased weight shift to left;Antalgic Gait velocity: decreased Gait velocity interpretation: Below normal speed for age/gender General Gait Details: TDWB but increased to no more than 25% with demonstration and uncomfortable with any LLE movmeent   Stairs            Wheelchair Mobility    Modified Rankin (Stroke Patients Only)       Balance  Overall balance assessment: Needs assistance Sitting-balance support: Feet supported Sitting balance-Leahy Scale: Good   Postural control: Right lateral lean Standing balance support: Bilateral upper extremity supported Standing balance-Leahy Scale: Poor Standing balance comment: Pt is more limited by her preference not to WB at all through LLE                    Cognition Arousal/Alertness: Awake/alert Behavior During Therapy: [facility] PsWFL for tasks assessed/performed Overall Cognitive Status: Within Functional Limits for tasks assessed                      Exercises Total Joint Exercises Ankle Circles/Pumps: AROM;Both;20 reps Quad Sets: AROM;Both;10 reps Hip ABduction/ADduction: AROM;AAROM;Both;10 reps Long Arc Quad: Strengthening;Right;Left;AROM;10 reps Knee Flexion: AROM;AAROM;Strengthening;Both;10 reps    General Comments General comments (skin integrity, edema, etc.): Pt is extremely motivated and willing to work.  Luna FuseGranson reports she is going to inpt therapy and [place] [facility] [place] [facility] [place] [facility]      Pertinent Vitals/Pain Pain Assessment: 0-10 Pain Score: 6  Pain Location: L leg with any movement Pain Descriptors / Indicators: Aching Pain Intervention(s): Limited activity within patient's tolerance;Premedicated before session;Monitored during session    Home Living                      Prior Function            PT Goals (current goals can now be found in the care plan section) Progress towards PT goals: Progressing toward goals    Frequency  Min 6X/week    PT  Plan Current plan remains appropriate    Co-evaluation             End of Session Equipment Utilized During Treatment: Other (comment) (FWW) Activity Tolerance: Patient limited by fatigue;Patient limited by pain Patient left: in chair;with call bell/phone within reach;with family/visitor present     Time: [id number] PT Time Calculation (min) (ACUTE ONLY): 15 min  Charges:   $Gait Training: 8-22 mins                    G Codes:      Ivar DrapeStout, Wetzel Meester E 07/05/2014, 9:28 AM   Samul Dadauth Seanne Chirico, PT MS Acute Rehab Dept. Number: [phone]

## 2014-07-05 NOTE — Plan of Care (Signed)
Problem: Phase I Progression Outcomes Goal: Post op pain controlled with appropriate interventions Outcome: Completed/Met Date Met:  07/05/14

## 2014-07-05 NOTE — Progress Notes (Signed)
Pharmacy Re:  Lovenox Indication: DVT prophylaxis  Pt. continues to have a drop in H/H  (8.9 >> 7.2 yesterday).  Please consider holding Lovenox and using SCD's if you feel appropriate.  Labs:  Recent Labs  07/03/14 0519 07/04/14 0437 07/05/14 0655  HGB 8.9* 7.2* 9.4*  HCT 26.2* 22.5* 27.8*  PLT 175 145* 148*  CREATININE 0.51 0.53 0.45*   Medications:  Scheduled:  . sodium chloride   Intravenous Once  . amLODipine  5 mg Oral Daily  . docusate sodium  100 mg Oral BID  . enoxaparin (LOVENOX) injection  20 mg Subcutaneous Q24H  . ferrous sulfate  325 mg Oral TID PC  . pravastatin  20 mg Oral Daily    Nadara MustardNita Taino Maertens, PharmD., MS Clinical Pharmacist Pager:  [phone] Thank you for allowing pharmacy to be part of this patients care team. 07/05/2014,1:25 PM

## 2014-07-05 NOTE — Clinical Social Work Psychosocial (Signed)
Clinical Social Work Department BRIEF PSYCHOSOCIAL ASSESSMENT 07/05/2014  Patient:  [name], [name]     Account Number:  [id number]     Admit date:  07/02/2014  Clinical Social Worker:  Delrae Sawyers  Date/Time:  07/05/2014 03:47 PM  Referred by:  Physician  Date Referred:  07/05/2014 Referred for  SNF Placement   Other Referral:   CIR (at [facility] and in [place], [place])   Interview type:  Patient Other interview type:   Pt's grandson, Grayland Ormond, also present at bedside.    PSYCHOSOCIAL DATA Living Status:  FAMILY Admitted from facility:   Level of care:   Primary support name:  Cammie Tran Primary support relationship to patient:  CHILD, ADULT Degree of support available:   Strong support system.    CURRENT CONCERNS Current Concerns  Post-Acute Placement   Other Concerns:   none.    SOCIAL WORK ASSESSMENT / PLAN Weekend CSW received referral for possible SNF placement [facility] or [place]) once pt medically stable. Per chart review, CIR admissions liaison states pt is NOT interested in SNF placement at time of discharge.    Weekend CSW met with pt and pt's grandson at bedside. Pt's grandson provided interruption and deferred request for interpreter. Pt stated pt prefers [facility] or CIR. Pt currently refusing SNF placement at time of discharge. Weekend RNCM updated regarding information above.    Weekend CSW signing off. Will notify Unit CSW on Monday 07/07/2014.   Assessment/plan status:  Psychosocial Support/Ongoing Assessment of Needs Other assessment/ plan:   none.   Information/referral to community resources:   Pt refusing SNF placement at this time.    PATIENT'S/FAMILY'S RESPONSE TO PLAN OF CARE: Pt not agreeable to CSW plan of care. Pt expressed no further questions or concerns at this time.       Lubertha Sayres, [place] [phone]) Licensed Clinical Social Worker Orthopedics [phone]) and Surgical [phone])

## 2014-07-06 NOTE — Progress Notes (Signed)
Physical Therapy Treatment Patient Details Name: [name] MRN: [id number] DOB: 1946-03-03 Today's Date: 07/06/2014    History of Present Illness Pt. is a [place] woman who fell on 07/02/14, sustaining left femur fx (midshaft).  She underwent ORIF .  PMH of HTN    PT Comments    Pt very pleasant and motivated to work on mobility.  Pt able to ambulate twice today and indicates pain as "mild".  Still feel pt would benefit from CIR level of therapies at D/C.  Will continue to follow while on acute.    Follow Up Recommendations  CIR     Equipment Recommendations  Rolling walker with 5" wheels;3in1 (PT)    Recommendations for Other Services       Precautions / Restrictions Precautions Precautions: Fall Restrictions Weight Bearing Restrictions: Yes LLE Weight Bearing: Partial weight bearing LLE Partial Weight Bearing Percentage or Pounds: 50%    Mobility  Bed Mobility               General bed mobility comments: pt sitting in recliner.    Transfers Overall transfer level: Needs assistance Equipment used: Rolling walker (2 wheeled) Transfers: Sit to/from Stand Sit to Stand: Min assist         General transfer comment: cues for UE use especially with coming to sit.    Ambulation/Gait Ambulation/Gait assistance: Min assist Ambulation Distance (Feet): 15 Feet (x2) Assistive device: Rolling walker (2 wheeled) Gait Pattern/deviations: Step-to pattern;Decreased step length - right;Decreased stance time - left;Trunk flexed     General Gait Details: Cues for more upright posture and encouragement.  pt able to ambulate x2 with seated rest break in between.     Stairs            Wheelchair Mobility    Modified Rankin (Stroke Patients Only)       Balance Overall balance assessment: Needs assistance         Standing balance support: Bilateral upper extremity supported;During functional activity Standing balance-Leahy Scale: Poor                       Cognition Arousal/Alertness: Awake/alert Behavior During Therapy: WFL for tasks assessed/performed Overall Cognitive Status: Within Functional Limits for tasks assessed                      Exercises General Exercises - Lower Extremity Ankle Circles/Pumps: AROM;Both;20 reps Quad Sets: AROM;Left;10 reps Long Arc Quad: AROM;Left;10 reps Hip ABduction/ADduction: AROM;Left;10 reps    General Comments        Pertinent Vitals/Pain Pain Assessment: 0-10 Pain Score: 3  Pain Location: L thigh Pain Descriptors / Indicators: Aching Pain Intervention(s): Limited activity within patient's tolerance;Premedicated before session;Repositioned    Home Living                      Prior Function            PT Goals (current goals can now be found in the care plan section) Acute Rehab PT Goals PT Goal Formulation: With patient/family Time For Goal Achievement: 07/10/14 Potential to Achieve Goals: Good Progress towards PT goals: Progressing toward goals    Frequency  Min 6X/week    PT Plan Current plan remains appropriate    Co-evaluation             End of Session Equipment Utilized During Treatment: Gait belt Activity Tolerance: Patient tolerated treatment well Patient left: in chair;with call bell/phone  within reach;with family/visitor present     Time: [id number] PT Time Calculation (min) (ACUTE ONLY): 25 min  Charges:  $Gait Training: 8-22 mins $Therapeutic Exercise: 8-22 mins                    G CodesSunny Cruz:      [name], [place] [phone] 07/06/2014, 2:29 PM

## 2014-07-06 NOTE — Progress Notes (Signed)
  07/06/2014 4 Days Post-Op Procedure(s) (LRB): ORIF FEMORAL NAIL (Left) Patient reports pain as mild.  Family in room at this time translating for the patient. Patient seen in rounds with Dr. Lequita HaltAluisio. Patient is well, but has had some minor complaints of pain in the thigh, requiring pain medications They will be PWB 50% to the left. Plan is to go home versus Rehab after hospital stay.  Vital signs in last 24 hours: Temp:  [98.5 F (36.9 C)-99 F (37.2 C)] 98.6 F (37 C) (11/15 0700) Pulse Rate:  [77-89] 89 (11/15 0700) Resp:  [16] 16 (11/15 0700) BP: (118-148)/(50-66) 148/66 mmHg (11/15 0700) SpO2:  [97 %-100 %] 100 % (11/15 0700)  I&O's: I/O last 3 completed shifts: In: 720 [P.O.:720] Out: -     Labs:  Recent Labs  07/04/14 0437 07/05/14 0655  HGB 7.2* 9.4*    Recent Labs  07/04/14 0437 07/05/14 0655  WBC 5.5 6.5  RBC 2.45* 3.14*  HCT 22.5* 27.8*  PLT 145* 148*    Recent Labs  07/04/14 0437 07/05/14 0655  NA 144 143  K 3.5* 3.4*  CL 108 104  CO2 27 26  BUN 10 9  CREATININE 0.53 0.45*  GLUCOSE 99 96  CALCIUM 7.4* 8.0*   No results for input(s): LABPT, INR in the last 72 hours.   EXAM: General - Patient is Alert and Appropriate Extremity - Neurovascular intact Sensation intact distally Dressing - dressing C/D/I Motor Function - intact, moving foot and toes well on exam.   Past Medical History  Diagnosis Date  . Hypertension   . Hyperlipidemia     Assessment/Plan: 4 Days Post-Op Procedure(s) (LRB): ORIF FEMORAL NAIL (Left) Principal Problem:   Femur fracture, left Active Problems:   Expected blood loss anemia  Estimated body mass index is 16.46 kg/(m^2) as calculated from the following:   Height as of this encounter: 5\' 2"  (1.575 m).   Weight as of this encounter: 40.824 kg (90 lb). Up with therapy Plan for discharge tomorrow Discharge home with home health versus SNF  DVT Prophylaxis - Lovenox Partial Weight-Bearing 50% to left  leg Awaiting SNF placement, possibly in [place]. Likely Monday D/C. HGB 9.4 yesterday after blood.  Avel Peacerew Darrelle Barrell, PA-C Orthopaedic Surgery 07/06/2014, 10:25 AM

## 2014-07-07 MED ORDER — ENOXAPARIN SODIUM 30 MG/0.3ML ~~LOC~~ SOLN: 20.0000 mg | SUBCUTANEOUS | Status: DC

## 2014-07-07 MED ORDER — FERROUS SULFATE 325 (65 FE) MG PO TABS: 325.0000 mg | ORAL_TABLET | Freq: Three times a day (TID) | ORAL | Status: DC

## 2014-07-07 MED ORDER — HYDROCODONE-ACETAMINOPHEN 5-325 MG PO TABS: 1.0000 | ORAL_TABLET | Freq: Four times a day (QID) | ORAL | Status: DC | PRN

## 2014-07-07 MED ORDER — METHOCARBAMOL 500 MG PO TABS: 500.0000 mg | ORAL_TABLET | Freq: Four times a day (QID) | ORAL | Status: DC | PRN

## 2014-07-07 MED ORDER — POLYETHYLENE GLYCOL 3350 17 G PO PACK: 17.0000 g | PACK | Freq: Every day | ORAL | Status: DC | PRN

## 2014-07-07 MED ORDER — DSS 100 MG PO CAPS: 100.0000 mg | ORAL_CAPSULE | Freq: Two times a day (BID) | ORAL | Status: AC

## 2014-07-07 NOTE — Progress Notes (Signed)
Physical Therapy Treatment Patient Details Name: [name] MRN: [id number] DOB: 08-19-1946 Today's Date: 07/07/2014    History of Present Illness Pt. is a [place] woman who fell on 07/02/14, sustaining left femur fx (midshaft).  She underwent ORIF .  PMH of HTN    PT Comments    Continuing progress with functional mobility; Noted pain was increased compared to yesterday's session, and pt was still able to increase amb distance and activity tolerance;  Continue to recommend comprehensive inpatient rehab (CIR) for post-acute therapy needs.    Follow Up Recommendations  CIR     Equipment Recommendations  Rolling walker with 5" wheels;3in1 (PT)    Recommendations for Other Services       Precautions / Restrictions Precautions Precautions: Fall Precaution Comments: Education officer, communityacific Interpreter service used; interpreter number [phone] Restrictions LLE Weight Bearing: Partial weight bearing LLE Partial Weight Bearing Percentage or Pounds: 50% PWB    Mobility  Bed Mobility                  Transfers Overall transfer level: Needs assistance Equipment used: Rolling walker (2 wheeled) Transfers: Sit to/from Stand Sit to Stand: Min guard         General transfer comment: cues for UE use especially for control with stand to sit.    Ambulation/Gait Ambulation/Gait assistance: Min guard (with physical contact) Ambulation Distance (Feet): 40 Feet Assistive device: Rolling walker (2 wheeled) Gait Pattern/deviations: Step-to pattern;Decreased stance time - left;Decreased step length - right Gait velocity: decreased   General Gait Details: cues for gait sequence and to unweigh LLE in stance to 50%PWB by taking half of her weight onto RW when she steps her RLE   Information systems managertairs            Wheelchair Mobility    Modified Rankin (Stroke Patients Only)       Balance           Standing balance support: Bilateral upper extremity supported Standing balance-Leahy Scale:  Poor                      Cognition Arousal/Alertness: Awake/alert Behavior During Therapy: WFL for tasks assessed/performed Overall Cognitive Status: Within Functional Limits for tasks assessed                      Exercises Total Joint Exercises Quad Sets: AROM;Both;10 reps Long Arc Quad: AROM;Strengthening;Left;10 reps Knee Flexion: AROM;Left;10 reps;Seated (unable to bend back to touch chair with heel with femur fully supported on chair)    General Comments General comments (skin integrity, edema, etc.): Education officer, communityacific Interpreter service used; interpreter number [phone]      Pertinent Vitals/Pain Pain Assessment: 0-10 Pain Score: 5  Pain Location: L thigh with motion; no pain when at rest Pain Descriptors / Indicators: Grimacing Pain Intervention(s): Monitored during session;Premedicated before session;Repositioned    Home Living                      Prior Function            PT Goals (current goals can now be found in the care plan section) Acute Rehab PT Goals Patient Stated Goal: rehab then home PT Goal Formulation: With patient/family Time For Goal Achievement: 07/10/14 Potential to Achieve Goals: Good Progress towards PT goals: Progressing toward goals    Frequency  Min 6X/week    PT Plan Current plan remains appropriate    Co-evaluation  End of Session Equipment Utilized During Treatment: Gait belt Activity Tolerance: Patient tolerated treatment well Patient left: in chair;with call bell/phone within reach;with family/visitor present     Time: 0925-1000 PT Time Calculation (min) (ACUTE ONLY): 35 min  Charges:  $Gait Training: 8-22 mins $Therapeutic Exercise: 8-22 mins                    G Codes:      Van ClinesGarrigan, Ikenna Ohms Hamff 07/07/2014, 10:24 AM  Van ClinesHolly Tris Howell, PT  Acute Rehabilitation Services Pager [phone] Office [phone]

## 2014-07-07 NOTE — Progress Notes (Signed)
     Subjective: 5 Days Post-Op Procedure(s) (LRB): ORIF FEMORAL NAIL (Left)   Translation through a family member. Patient reports pain as mild, pain controlled. Continually getting better. No events throughout the night or day.  States that the plan is to change rooms tomorrow.  Objective:   VITALS:   Filed Vitals:   07/07/14 1100  BP: 130/54  Pulse: 78  Temp: 98.5 F (36.9 C)  Resp: 16    Dorsiflexion/Plantar flexion intact Incision: dressing C/D/I No cellulitis present Compartment soft Bruising of the leg, mild anterior and moderate in the posterior leg  LABS  Recent Labs  07/05/14 0655  HGB 9.4*  HCT 27.8*  WBC 6.5  PLT 148*     Recent Labs  07/05/14 0655  NA 143  K 3.4*  BUN 9  CREATININE 0.45*  GLUCOSE 96     Assessment/Plan: 5 Days Post-Op Procedure(s) (LRB): ORIF FEMORAL NAIL (Left) Up with therapy Discharge to CIR tomorrow. [facility]. Follow up with OLIN,Kaetlyn Noa D in 2 weeks.  Contact information:  [facility] [address] [place] [id number] [phone]        Anastasio AuerbachMatthew S. Jailynne Opperman   PAC  07/07/2014, 7:29 PM

## 2014-07-07 NOTE — Care Management Note (Signed)
CARE MANAGEMENT NOTE 07/07/2014 :  07/07/14 1341 Vance PeperSusan Jashua Knaak, RN BSN Case Manager Case Manager contacted [place] [facility] to confirm receipt of fax and to inquire about availability of bed for patient. Catalina informed CM that they will assign chart to someone for review and it will take 24hrs before someone will contact us. Case mnager contacted Janine with [facility] and relayed this information. Asked that patient be placed on top of list for CIR bed for 07/08/14. Case Manager will continue to monitor.  07/07/14  1030 Vance PeperSusan Lisia Westbay, RN BSN Case Manager Case manager faxed pertinent patient information to [place] -Admissions @ [facility]-fax [phone]. Phone-[phone].    07/03/14 1615 Vance PeperSusan Paula Busenbark, RN BSN Case Manager CM spoke with Campbell [facility]Janine [facility]. She stated that they wouldnt have a bed until Monday, and that patient would also be interested in a [facility]. [phone]. This will be followed up with on Monday, there intake department is closed.

## 2014-07-07 NOTE — Discharge Summary (Signed)
Physician Discharge Summary  Patient ID: [name] MRN: [id number] DOB/AGE: Mar 30, 1946 68 y.o.  Admit date: 07/02/2014 Discharge date:    07/08/2014  Procedures:  Procedure(s) (LRB): ORIF FEMORAL NAIL (Left)  Attending Physician:  Dr. Durene RomansMatthew Olin   Admission Diagnoses:   Left comminuted displaced fracture of the midshaft of the femur  Discharge Diagnoses:  Principal Problem:   Femur fracture, left Active Problems:   Expected blood loss anemia  Past Medical History  Diagnosis Date  . Hypertension   . Hyperlipidemia     HPI: Pt is a 68 y.o. female complaining of left upper leg pain after a fall earlier today. She was getting down off a step stool, stepped off and fell back with instant left leg pain. She was subsequently was brought to the ER where x-rays revealed a comminuted displaced fracture of the midshaft of the femur. ED physician consult Dr. Charlann Boxerlin. Last time she ate was a small snack at 1000, she didn't eat a full breakfast today. [place] from [place] and is curious on how long she will have to stay in the hospital. Various options are discussed with the patient through a relative as a translator, and the procedure was explained. Risks, benefits and expectations were discussed with the patient and family. Patient and family understand the risks, benefits and expectations and wishes to proceed with surgery.   PCP: No primary care provider on file.   Discharged Condition: good  Hospital Course:  Patient underwent the above stated procedure on 07/02/2014. Patient tolerated the procedure well and brought to the recovery room in good condition and subsequently to the floor.  POD #1 BP: 120/54 ; Pulse: 84 ; Temp: 98 F (36.7 C) ; Resp: 16  Patient reports pain as mild, pain controlled. States that there was significant pain after waking up from surgery, but much better today. No events throughout the night.  Dorsiflexion/plantar flexion intact, incision: dressing C/D/I,  no cellulitis present and compartment soft.   LABS  Basename    HGB  8.9  HCT  26.2   POD #2  BP: 114/47 ; Pulse: 81 ; Temp: 98.5 F (36.9 C) ; Resp: 16  Patient reports pain as mild, pain controlled. No events throughout the night. Progressing slowly with PT. Transfused with 1 unit of blood. Dorsiflexion/plantar flexion intact, incision: dressing C/D/I, no cellulitis present and compartment soft.   LABS  Basename    HGB  7.2  HCT  22.5   POD #3  BP: 115/53 ; Pulse: 80 ; Temp: 98.5 F (36.9 C) ; Resp: 16 Patient reports pain as well controlled. Located to Left leg. Tolerating PO's. Reports improvement since transfusion. Denies SOB, CP, or Calf pain. Family at bedside. Dorsiflexion/plantar flexion intact, incision: dressing C/D/I, no cellulitis present and compartment soft.   LABS  Basename    HGB  9.4  HCT  27.8   POD #4  BP: 148/66 ; Pulse: 89 ; Temp: 98.6 F (37 C) ; Resp: 16 Patient reports pain as mild. Family in room at this time translating for the patient. Patient is well, but has had some minor complaints of pain in the thigh, requiring pain medications. They will be PWB 50% to the left.  LABS   No new labs  POD #5  BP: 130/54 ; Pulse: 78 ; Temp: 98.5 F (36.9 C) ; Resp: 16  Patient reports pain as mild, pain controlled. Continually getting better. No events throughout the night or day. States that the  plan is to change rooms tomorrow. Dorsiflexion/plantar flexion intact, incision: dressing C/D/I, no cellulitis present and compartment soft.   LABS   No new labs  POD #6  BP: 128/56 ; Pulse: 79 ; Temp: 98.6 F (37 C) ; Resp: 14 Patient reports pain as mild to moderate with activity, other wise OK. No questions from her or her grandson other than timing to move to CIR. Dorsiflexion/plantar flexion intact, incision: dressing C/D/I, no cellulitis present and compartment soft.   LABS   No new labs   Discharge Exam: General appearance: alert, cooperative and no  distress Extremities: Homans sign is negative, no sign of DVT, no edema, redness or tenderness in the calves or thighs and no ulcers, gangrene or trophic changes  Disposition:    [facility] with follow up in 2 weeks.    Follow up with OLIN,Toris Laverdiere D in 2 weeks.  Contact information:  [facility] [address] [place] [address] [phone]       Discharge Instructions    Call MD / Call 911    Complete by:  As directed   If you experience chest pain or shortness of breath, CALL 911 and be transported to the hospital emergency room.  If you develope a fever above 101 F, pus (white drainage) or increased drainage or redness at the wound, or calf pain, call your surgeon's office.     Constipation Prevention    Complete by:  As directed   Drink plenty of fluids.  Prune juice may be helpful.  You may use a stool softener, such as Colace (over the counter) 100 mg twice a day.  Use MiraLax (over the counter) for constipation as needed.     Diet - low sodium heart healthy    Complete by:  As directed      Discharge instructions    Complete by:  As directed   Change the dressing daily with 4x4 guaze and tape.  Keep the area dry and clean.  Follow up in 2 weeks with an Orthopaedic Doctor in [place], [place] or at [facility]. Call with any questions or concerns.     Driving restrictions    Complete by:  As directed   No driving for 4 weeks     Partial weight bearing    Complete by:  As directed   % Body Weight:  50  Laterality:  left  Extremity:  Lower             Medication List    TAKE these medications        amLODipine 5 MG tablet  Commonly known as:  NORVASC  Take 5 mg by mouth daily.     DSS 100 MG Caps  Take 100 mg by mouth 2 (two) times daily.     enoxaparin 30 MG/0.3ML injection  Commonly known as:  LOVENOX  Inject 0.2 mLs (20 mg total) into the skin daily.     ferrous sulfate 325 (65 FE) MG tablet   Take 1 tablet (325 mg total) by mouth 3 (three) times daily after meals.     HYDROcodone-acetaminophen 5-325 MG per tablet  Commonly known as:  NORCO/VICODIN  Take 1-2 tablets by mouth every 6 (six) hours as needed for moderate pain.     methocarbamol 500 MG tablet  Commonly known as:  ROBAXIN  Take 1 tablet (500 mg total) by mouth every 6 (six) hours as needed for muscle spasms.  polyethylene glycol packet  Commonly known as:  MIRALAX / GLYCOLAX  Take 17 g by mouth daily as needed for mild constipation.     pravastatin 20 MG tablet  Commonly known as:  PRAVACHOL  Take 20 mg by mouth daily.     Vitamin D (Ergocalciferol) 50000 UNITS Caps capsule  Commonly known as:  DRISDOL  Take 50,000 Units by mouth every 7 (seven) days. Mondays         Signed:   Anastasio AuerbachMatthew S. Margarito Dehaas   PA-C  07/08/2014, 3:02 PM

## 2014-07-07 NOTE — Progress Notes (Signed)
Rehab admissions - I am following pt's case and noted therapy's continued recommendation for inpatient rehab. Unfortunately, we have no available inpatient rehab beds today. I communicated this to pt and her grandson. They understood that social Investment banker, operationalworker/case manager would now be following up with them.  I updated Darl PikesSusan, case Production designer, theatre/television/filmmanager and Almira CoasterGina with social work. Pt's first choice was to pursue inpatient [place]hab in [place] and Darl PikesSusan is looking into this. If not an option, pt's second choice was [facility].  Please call me with any questions.   Thanks.  Juliann MuleJanine Ayaz Sondgeroth, PT Rehabilitation Admissions Coordinator [phone]

## 2014-07-07 NOTE — Plan of Care (Signed)
Problem: Phase II Progression Outcomes Goal: CMS/Neurovascular status WDL Outcome: Completed/Met Date Met:  07/07/14

## 2014-07-07 NOTE — PMR Pre-admission (Signed)
PMR Admission Coordinator Pre-Admission Assessment  Patient: [name] is an 68 y.o., female MRN: [id number] DOB: December 14, 1945 Height: 5\' 2"  (157.5 cm) Weight: 40.824 kg (90 lb)              Insurance Information   PRIMARY: Medicare A only      Policy#: [id number]      Subscriber: self Pre-Cert#: verified in [employer]: retired [employer]. Date: A: 02-20-11     Deduct: $1260      Out of Pocket Max: none      Life Max: unlimited CIR: 100%      SNF: 100% days 1-20; 80% days 21-100 (100 day visit max) Outpatient: no coverage     Co-Pay: NA Home Health: 100%      Co-Pay: none, no visit limits DME: no coverage     Co-Pay: NA Providers: pt's preference.  Emergency Contact Information Contact Information    Name Relation Home Work [place] Daughter [phone]     Bradner,Vu Son   [phone]   [place], [place]   [phone]     Current Medical History  Patient Admitting Diagnosis: left mid shaft femur fracture  History of Present Illness: [name] is a 68 y.o. [name] female with history of HTN, hyperlipidemia; who was getting off the ladder when she fell backward with immediate onset of left leg pain. She was evaluated in ED 07/02/14 and was found to have left communited displaced fracture of the midshaft of the femur. She was evaluated by Dr. Charlann Boxer and underwent ORIF left mid shaft femur fracture. Post op PWB LLE and on lovenox for DVT prophylaxis. PT evaluation done yesterday and patient noted to have difficulty maintaining WB precautions as well as fear limiting mobility. MD and PT recommending CIR for follow up therapy.  Past Medical History  Past Medical History  Diagnosis Date  . Hypertension   . Hyperlipidemia     Family History  family history includes Hypertension in her father.  Prior Rehab/Hospitalizations: none   Current Medications  Current facility-administered medications: 0.9 %  sodium chloride infusion, , Intravenous, Once, Genelle Gather Babish,  PA-C;  alum & mag hydroxide-simeth (MAALOX/MYLANTA) 200-200-20 MG/5ML suspension 30 mL, 30 mL, Oral, Q4H PRN, Genelle Gather Babish, PA-C;  amLODipine (NORVASC) tablet 5 mg, 5 mg, Oral, Daily, Genelle Gather Babish, PA-C, 5 mg at 07/07/14 0102 bisacodyl (DULCOLAX) suppository 10 mg, 10 mg, Rectal, Daily PRN, Genelle Gather Babish, PA-C;  diphenhydrAMINE (BENADRYL) capsule 25 mg, 25 mg, Oral, Q6H PRN, Genelle Gather Babish, PA-C;  docusate sodium (COLACE) capsule 100 mg, 100 mg, Oral, BID, Genelle Gather Babish, PA-C, 100 mg at 07/07/14 2136;  enoxaparin (LOVENOX) injection 20 mg, 20 mg, Subcutaneous, Q24H, Shelda Pal, MD, 20 mg at 07/07/14 1224 ferrous sulfate tablet 325 mg, 325 mg, Oral, TID PC, Genelle Gather Babish, PA-C, 325 mg at 07/07/14 1700;  HYDROcodone-acetaminophen (NORCO/VICODIN) 5-325 MG per tablet 1-2 tablet, 1-2 tablet, Oral, Q6H PRN, Genelle Gather Babish, PA-C, 1 tablet at 07/07/14 2136;  HYDROmorphone (DILAUDID) injection 0.5-2 mg, 0.5-2 mg, Intravenous, Q2H PRN, Genelle Gather Babish, PA-C menthol-cetylpyridinium (CEPACOL) lozenge 3 mg, 1 lozenge, Oral, PRN **OR** phenol (CHLORASEPTIC) mouth spray 1 spray, 1 spray, Mouth/Throat, PRN, Genelle Gather Babish, PA-C;  methocarbamol (ROBAXIN) tablet 500 mg, 500 mg, Oral, Q6H PRN **OR** methocarbamol (ROBAXIN) 500 mg in dextrose 5 % 50 mL IVPB, 500 mg, Intravenous, Q6H PRN, Genelle Gather Babish, PA-C, 500 mg at 07/02/14 2009 metoCLOPramide (REGLAN) tablet 5-10  mg, 5-10 mg, Oral, Q8H PRN **OR** metoCLOPramide (REGLAN) injection 5-10 mg, 5-10 mg, Intravenous, Q8H PRN, Genelle GatherMatthew Scott Babish, PA-C;  ondansetron (ZOFRAN) tablet 4 mg, 4 mg, Oral, Q6H PRN **OR** ondansetron (ZOFRAN) injection 4 mg, 4 mg, Intravenous, Q6H PRN, Genelle GatherMatthew Scott Babish, PA-C;  polyethylene glycol (MIRALAX / GLYCOLAX) packet 17 g, 17 g, Oral, Daily PRN, Genelle GatherMatthew Scott Babish, PA-C pravastatin (PRAVACHOL) tablet 20 mg, 20 mg, Oral, Daily, Genelle GatherMatthew Scott Babish, PA-C, 20 mg at 07/07/14  [id number];  sodium chloride 0.9 % 1,000 mL with potassium chloride 10 mEq infusion, , Intravenous, Continuous, Genelle GatherMatthew Scott Babish, PA-C, Last Rate: 75 mL/hr at 07/04/14 0241  Patients Current Diet: Diet regular (note: pt does not eat beef for personal reasons)  Precautions / Restrictions Precautions Precautions: Fall Precaution Comments: Warden/rangeracific Interpreter service used; interpreter number [phone] Restrictions Weight Bearing Restrictions: Yes LLE Weight Bearing: Partial weight bearing LLE Partial Weight Bearing Percentage or Pounds: 50% PWB   Prior Activity Level Community (5-7x/wk): Pt was independent prior to this fall and 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 Pt and her family live in [place]. Pt enjoys gardening and sustained this injury after falling off a ladder while trying to pick fruit from the tree. Pt does not drive but gets out every other day with family on errands and enjoys going to the [employer].  Home Assistive Devices / Equipment Home Assistive Devices/Equipment: Dentures (specify type) Home Equipment: None  Prior Functional Level Prior Function Level of Independence: Independent Comments: daughter in law reports pt. 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  Current Functional Level Cognition  Overall Cognitive Status: Within Systems developerunctional Limits for tasks assessed Orientation Level: Oriented X4    Extremity Assessment (includes Sensation/Coordination)  Upper Extremity Assessment: Defer to OT evaluation Lower Extremity Assessment: LLE deficits/detail LLE Deficits / Details: able to ankle pump and quad set; limited due to post op status and pain Cervical / Trunk Assessment: Normal    ADLs  Anticipate ADL needs    Mobility  Overal bed mobility: Needs Assistance, +2 for physical assistance Bed Mobility: Supine to Sit Supine to sit: +2 for physical assistance, Mod assist General bed mobility comments: pt sitting in  recliner.      Transfers  Overall transfer level: Needs assistance Equipment used: Rolling walker (2 wheeled) Transfers: Sit to/from Stand Sit to Stand: Min guard Stand pivot transfers: Min assist General transfer comment: cues for UE use especially for control with stand to sit.      Ambulation / Gait / Stairs / Wheelchair Mobility  Ambulation/Gait Ambulation/Gait assistance: Min guard (with physical contact) Ambulation Distance (Feet): 40 Feet Assistive device: Rolling walker (2 wheeled) Gait Pattern/deviations: Step-to pattern, Decreased stance time - left, Decreased step length - right Gait velocity: decreased Gait velocity interpretation: Below normal speed for age/gender General Gait Details: cues for gait sequence and to unweigh LLE in stance to 50%PWB by taking half of her weight onto RW when she steps her RLE    Posture / Balance Overall balance assessment: Needs assistance Sitting-balance support: No upper extremity supported;Feet supported Sitting balance-Leahy Scale: Fair Standing balance support: Bilateral upper extremity supported;During functional activity Standing balance-Leahy Scale: Poor Standing balance comment: needs external support for upright    Special needs/care consideration BiPAP/CPAP no  CPM no  Continuous Drip IV no  Dialysis no         Life Vest no Oxygen no  Special Bed no  Trach Size no  Wound Vac (area)  no       Skin - current L hip surgical incision                               Bowel mgmt: Last BM 07/02/14 Bladder mgmt: using BSC or bedpan with assist Diabetic mgmt no    Previous Home Environment Living Arrangements: Children, Spouse/significant other Available Help at Discharge: Family, Available 24 hours/day (grandson) Type of Home: House Home Layout: Two level Alternate Level Stairs-Rails: Right Alternate Level Stairs-Number of Steps: 10-12 Home Access: Stairs to enter Entrance Stairs-Rails: Right Entrance Stairs-Number of  Steps: 2 Home Care Services: Other (Comment) (unsure at this time ) Additional Comments: daughter in law states pt. can stay downstairs if needed  Discharge Living Setting Plans for Discharge Living Setting: Patient's home Type of Home at Discharge: House Discharge Home Layout: Two level (can stay on main floor if needed) Alternate Level Stairs-Rails: Right Alternate Level Stairs-Number of Steps: flight Discharge Home Access: Stairs to enter Entrance Stairs-Rails: Right Entrance Stairs-Number of Steps: 2 Does the patient have any problems obtaining your medications?: No  Social/Family/Support Systems Patient Roles: Other (Comment) (grandmother, mother and recently retired from [employer] architectwarehouse) SolicitorContact Information: Jerene DillingGrandson Kenny and son Vu are primary contacts Anticipated Caregiver: Lucila MaineGrandson, son and daughter in law Anticipated Caregiver's Contact Information: see above Ability/Limitations of Caregiver: no limitations Caregiver Availability: 24/7 Discharge Plan Discussed with Primary Caregiver: Yes Is Caregiver In Agreement with Plan?: Yes Does Caregiver/Family have Issues with Lodging/Transportation while Pt is in Rehab?: No  Goals/Additional Needs Patient/Family Goal for Rehab: Mod ind with PT/OT; NA for SLP Expected length of stay: 7 days Cultural Considerations: pt is [place] and speaks little to no [place] Dietary Needs: regular diet (note: pt does NOT eat beef for personal reasons) Equipment Needs: to be determined Special Service Needs: will likely need [place] interpreter as pt speaks little to no [place]  Pt/Family Agrees to Admission and willing to participate: Yes Program Orientation Provided & Reviewed with Pt/Caregiver Including Roles  & Responsibilities: Yes  Decrease burden of Care through IP rehab admission: NA  Possible need for SNF placement upon discharge: not anticipated  Patient Condition: This patient's medical and functional status has changed since the  consult dated: 07/04/14 in which the Rehabilitation Physician determined and documented that the patient's condition is appropriate for intensive rehabilitative care in an inpatient rehabilitation facility. See "History of Present Illness" (above) for medical update. Functional changes are: Currently requiring min guard assist to ambulate 40 ft RW. Patient's medical and functional status update has been discussed with the Rehabilitation physician and patient remains appropriate for inpatient rehabilitation. Will admit to inpatient rehab today.  Preadmission Screen Completed By:  Lelon FrohlichLogue, Larin Weissberg, 07/08/2014 2:21 PM ______________________________________________________________________   Discussed status with Dr. Riley KillSwartz on 07/08/14 at 1001 and received telephone approval for admission today.  Admission Coordinator:  Lelon FrohlichLogue, Rasha Ibe, time1001/Date11/17/15

## 2014-07-08 ENCOUNTER — Inpatient Hospital Stay (HOSPITAL_COMMUNITY)
Admission: RE | Admit: 2014-07-08 | Discharge: 2014-07-14 | DRG: 534 | Disposition: A | Discharge status: Home Health | Payer: Medicare Other | Source: Intra-hospital | Attending: Physical Medicine & Rehabilitation | Admitting: Physical Medicine & Rehabilitation

## 2014-07-08 DIAGNOSIS — K5900 Constipation, unspecified: Secondary | ICD-10-CM | POA: Diagnosis present

## 2014-07-08 DIAGNOSIS — E876 Hypokalemia: Secondary | ICD-10-CM | POA: Diagnosis present

## 2014-07-08 DIAGNOSIS — W11XXXA Fall on and from ladder, initial encounter: Secondary | ICD-10-CM | POA: Diagnosis present

## 2014-07-08 DIAGNOSIS — S72302E Unspecified fracture of shaft of left femur, subsequent encounter for open fracture type I or II with routine healing: Secondary | ICD-10-CM | POA: Diagnosis not present

## 2014-07-08 DIAGNOSIS — I10 Essential (primary) hypertension: Secondary | ICD-10-CM | POA: Diagnosis present

## 2014-07-08 DIAGNOSIS — S72352A Displaced comminuted fracture of shaft of left femur, initial encounter for closed fracture: Principal | ICD-10-CM | POA: Diagnosis present

## 2014-07-08 DIAGNOSIS — D62 Acute posthemorrhagic anemia: Secondary | ICD-10-CM | POA: Diagnosis present

## 2014-07-08 DIAGNOSIS — S72302S Unspecified fracture of shaft of left femur, sequela: Secondary | ICD-10-CM | POA: Diagnosis not present

## 2014-07-08 DIAGNOSIS — W19XXXS Unspecified fall, sequela: Secondary | ICD-10-CM | POA: Diagnosis not present

## 2014-07-08 DIAGNOSIS — E785 Hyperlipidemia, unspecified: Secondary | ICD-10-CM | POA: Diagnosis present

## 2014-07-08 DIAGNOSIS — W19XXXA Unspecified fall, initial encounter: Secondary | ICD-10-CM | POA: Insufficient documentation

## 2014-07-08 DIAGNOSIS — W19XXXD Unspecified fall, subsequent encounter: Secondary | ICD-10-CM | POA: Diagnosis not present

## 2014-07-08 DIAGNOSIS — S72302A Unspecified fracture of shaft of left femur, initial encounter for closed fracture: Secondary | ICD-10-CM | POA: Diagnosis present

## 2014-07-08 LAB — URINALYSIS, ROUTINE W REFLEX MICROSCOPIC
Bilirubin Urine: NEGATIVE
GLUCOSE, UA: NEGATIVE mg/dL
Hgb urine dipstick: NEGATIVE
KETONES UR: NEGATIVE mg/dL
Nitrite: NEGATIVE
PROTEIN: NEGATIVE mg/dL
Specific Gravity, Urine: <1.005 — ABNORMAL LOW (ref 1.005–1.030)
Urobilinogen, UA: 0.2 mg/dL (ref 0.0–1.0)
pH: 6.5 (ref 5.0–8.0)

## 2014-07-08 LAB — URINE MICROSCOPIC-ADD ON

## 2014-07-08 MED ORDER — TRAZODONE HCL 50 MG PO TABS: 25.0000 mg | ORAL_TABLET | Freq: Every evening | ORAL | Status: DC | PRN

## 2014-07-08 MED ORDER — PRAVASTATIN SODIUM 20 MG PO TABS
20.0000 mg | ORAL_TABLET | Freq: Every day | ORAL | Status: DC
  Administered 2014-07-09 – 2014-07-14 (×6): 20 mg via ORAL
  Filled 2014-07-08 (×7): qty 1

## 2014-07-08 MED ORDER — PROCHLORPERAZINE MALEATE 5 MG PO TABS
5.0000 mg | ORAL_TABLET | Freq: Four times a day (QID) | ORAL | Status: DC | PRN
  Filled 2014-07-08: qty 2

## 2014-07-08 MED ORDER — PROCHLORPERAZINE 25 MG RE SUPP: 12.5000 mg | Freq: Four times a day (QID) | RECTAL | Status: DC | PRN

## 2014-07-08 MED ORDER — PHENOL 1.4 % MT LIQD
1.0000 | OROMUCOSAL | Status: DC | PRN
  Filled 2014-07-08: qty 177

## 2014-07-08 MED ORDER — TRAMADOL HCL 50 MG PO TABS
50.0000 mg | ORAL_TABLET | Freq: Four times a day (QID) | ORAL | Status: DC | PRN
  Administered 2014-07-10 – 2014-07-12 (×3): 50 mg via ORAL
  Filled 2014-07-08 (×4): qty 1

## 2014-07-08 MED ORDER — MENTHOL 3 MG MT LOZG
1.0000 | LOZENGE | OROMUCOSAL | Status: DC | PRN
  Filled 2014-07-08: qty 9

## 2014-07-08 MED ORDER — GUAIFENESIN-DM 100-10 MG/5ML PO SYRP: 5.0000 mL | ORAL_SOLUTION | Freq: Four times a day (QID) | ORAL | Status: DC | PRN

## 2014-07-08 MED ORDER — ALUM & MAG HYDROXIDE-SIMETH 200-200-20 MG/5ML PO SUSP: 30.0000 mL | ORAL | Status: DC | PRN

## 2014-07-08 MED ORDER — ENOXAPARIN SODIUM 40 MG/0.4ML ~~LOC~~ SOLN
40.0000 mg | SUBCUTANEOUS | Status: DC
  Administered 2014-07-08: 40 mg via SUBCUTANEOUS
  Filled 2014-07-08: qty 0.4

## 2014-07-08 MED ORDER — DOCUSATE SODIUM 100 MG PO CAPS
100.0000 mg | ORAL_CAPSULE | Freq: Two times a day (BID) | ORAL | Status: DC
  Administered 2014-07-08 – 2014-07-14 (×11): 100 mg via ORAL
  Filled 2014-07-08 (×15): qty 1

## 2014-07-08 MED ORDER — BISACODYL 10 MG RE SUPP: 10.0000 mg | Freq: Every day | RECTAL | Status: DC | PRN

## 2014-07-08 MED ORDER — FERROUS SULFATE 325 (65 FE) MG PO TABS
325.0000 mg | ORAL_TABLET | Freq: Three times a day (TID) | ORAL | Status: DC
  Administered 2014-07-08 – 2014-07-09 (×2): 325 mg via ORAL
  Filled 2014-07-08 (×5): qty 1

## 2014-07-08 MED ORDER — ACETAMINOPHEN 325 MG PO TABS: 325.0000 mg | ORAL_TABLET | ORAL | Status: DC | PRN

## 2014-07-08 MED ORDER — HYDROCODONE-ACETAMINOPHEN 5-325 MG PO TABS
1.0000 | ORAL_TABLET | Freq: Two times a day (BID) | ORAL | Status: DC
  Administered 2014-07-09 – 2014-07-14 (×11): 1 via ORAL
  Filled 2014-07-08 (×11): qty 1

## 2014-07-08 MED ORDER — METHOCARBAMOL 500 MG PO TABS: 500.0000 mg | ORAL_TABLET | Freq: Four times a day (QID) | ORAL | Status: DC | PRN

## 2014-07-08 MED ORDER — HYDROCODONE-ACETAMINOPHEN 5-325 MG PO TABS
1.0000 | ORAL_TABLET | ORAL | Status: DC | PRN
  Administered 2014-07-08: 2 via ORAL
  Administered 2014-07-11 – 2014-07-14 (×4): 1 via ORAL
  Filled 2014-07-08 (×2): qty 2
  Filled 2014-07-08 (×4): qty 1

## 2014-07-08 MED ORDER — POTASSIUM CHLORIDE CRYS ER 10 MEQ PO TBCR
10.0000 meq | EXTENDED_RELEASE_TABLET | Freq: Two times a day (BID) | ORAL | Status: DC
  Administered 2014-07-08 – 2014-07-09 (×2): 10 meq via ORAL
  Filled 2014-07-08 (×4): qty 1

## 2014-07-08 MED ORDER — AMLODIPINE BESYLATE 5 MG PO TABS
5.0000 mg | ORAL_TABLET | Freq: Every day | ORAL | Status: DC
  Filled 2014-07-08 (×2): qty 1

## 2014-07-08 MED ORDER — PROCHLORPERAZINE EDISYLATE 5 MG/ML IJ SOLN: 5.0000 mg | Freq: Four times a day (QID) | INTRAMUSCULAR | Status: DC | PRN

## 2014-07-08 MED ORDER — FLEET ENEMA 7-19 GM/118ML RE ENEM
1.0000 | ENEMA | Freq: Once | RECTAL | Status: AC | PRN
Start: 2014-07-08 — End: 2014-07-08

## 2014-07-08 MED ORDER — POLYETHYLENE GLYCOL 3350 17 G PO PACK
17.0000 g | PACK | Freq: Every day | ORAL | Status: DC
Start: 2014-07-08 — End: 2014-07-14
  Administered 2014-07-12 – 2014-07-14 (×3): 17 g via ORAL
  Filled 2014-07-08 (×8): qty 1

## 2014-07-08 NOTE — H&P (Signed)
Physical Medicine and Rehabilitation Admission H&P    Chief Complaint  Patient presents with  . Left communited displaced fracture of the midshaft of the femur   HPI:  [name] is a 68 y.o. [place] female with history of HTN, hyperlipidemia; who was getting off the ladder when she fell backward with immediate onset of left leg pain. She was evaluated in ED 07/02/14 and was found to have left communited displaced fracture of the midshaft of the femur. She was evaluated by Dr. Charlann Boxerlin and underwent ORIF left mid shaft femur fracture. Post op PWB LLE and on lovenox for DVT prophylaxis. She developed ABLA with drop in hgb to 7.2 and she was transfused with one unit PRBC. She continues to have persistent hypokalemia.  Anxiety levels improving with patient showing progress as well as increased ability to maintain PWB with therapy.MD and PT recommending CIR for follow up therapy.    Review of Systems  HENT: Negative for hearing loss.   Eyes: Negative for blurred vision and double vision.  Respiratory: Negative for cough and shortness of breath.   Cardiovascular: Negative for claudication.  Gastrointestinal: Positive for constipation. Negative for nausea and vomiting.  Genitourinary: Positive for frequency. Negative for dysuria.  Musculoskeletal: Positive for joint pain. Negative for myalgias and back pain.  Neurological: Negative for dizziness and headaches.  Psychiatric/Behavioral: The patient is not nervous/anxious.       Past Medical History  Diagnosis Date  . Hypertension   . Hyperlipidemia     Past Surgical History  Procedure Laterality Date  . Orif femur fracture Left 07/02/2014    IM NAIL  . Femur im nail Left 07/02/2014    Procedure: ORIF FEMORAL NAIL;  Surgeon: Karla PalMatthew D Olin, MD;  [facility];  Service: Orthopedics;  Laterality: Left;    Family History  Problem Relation Age of Onset  . Hypertension Father     Social History:  [place]ives [place] in  [place]. Per reports that she has never smoked. She has never used smokeless tobacco. Per reports that she does not drink alcohol or use illicit drugs.    Allergies: No Known Allergies    Medications Prior to Admission  Medication Sig Dispense Refill  . amLODipine (NORVASC) 5 MG tablet Take 5 mg by mouth daily.    . pravastatin (PRAVACHOL) 20 MG tablet Take 20 mg by mouth daily.    . Vitamin D, Ergocalciferol, (DRISDOL) 50000 UNITS CAPS capsule Take 50,000 Units by mouth every 7 (seven) days. Mondays      Home: Home Living Family/patient expects to be discharged to:: Private residence Living Arrangements: Children, Spouse/significant other Available Help at Discharge: Family, Available 24 hours/day (grandson) Type of Home: House Home Access: Stairs to enter Secretary/administratorntrance Stairs-Number of Steps: 2 Entrance Stairs-Rails: Right Home Layout: Two level Alternate Level Stairs-Number of Steps: 10-12 Alternate Level Stairs-Rails: Right Home Equipment: None Additional Comments: daughter in law states pt. 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   Transfers Overall transfer level: Needs assistance Equipment used: Rolling walker (2 wheeled) Transfers: Sit to/from Stand Sit to Stand: Min guard Stand pivot transfers: Min assist General  transfer comment: cues for UE use especially for control with stand to sit.   Ambulation/Gait Ambulation/Gait assistance: Min guard (with physical contact) Ambulation Distance (Feet): 40 Feet Assistive device: Rolling walker (2 wheeled) Gait Pattern/deviations: Step-to  pattern, Decreased stance time - left, Decreased step length - right Gait velocity: decreased Gait velocity interpretation: Below normal speed for age/gender General Gait Details: cues for gait sequence and to unweigh LLE in stance to 50%PWB by taking half of her weight onto RW when she steps her RLE    ADL:    Cognition: Cognition Overall Cognitive Status: Within Functional Limits for tasks assessed Orientation Level: Oriented X4 Cognition Arousal/Alertness: Awake/alert Behavior During Therapy: WFL for tasks assessed/performed Overall Cognitive Status: Within Functional Limits for tasks assessed  Physical Exam: Blood pressure 128/56, pulse 79, temperature 98.6 F (37 C), temperature source Oral, resp. rate 14, height 5\' 2"  (1.575 m), weight 40.824 kg (90 lb), SpO2 99 %. Physical Exam  Nursing note and vitals reviewed. Constitutional: She is oriented to person, place, and time. She appears well-developed.  Thin female. Grandson assisted with interpretation. Understands little english   HENT:  Head: Normocephalic and atraumatic.  Right Ear: External ear normal.  Left Ear: External ear normal.  Eyes: Conjunctivae and EOM are normal. Pupils are equal, round, and reactive to light.  Neck: Normal range of motion. Neck supple. No JVD present. No tracheal deviation present. No thyromegaly present.  Cardiovascular: Normal rate and regular rhythm.  Exam reveals no friction rub.   No murmur heard. Respiratory: Effort normal and breath sounds normal. No respiratory distress. She has no wheezes. She has no rales. She exhibits no tenderness.  GI: Soft. Bowel sounds are normal. She exhibits no distension. There is no tenderness. There is no rebound and no guarding.  Musculoskeletal:  Pain with attempts at ROM left knee/left hip. Moves BUE and RLE without difficulty.   Neurological: She is alert and oriented to person, place, and time.  UES: 4+ to 5/5 bilaterally prox to distal. LLE: limited  proximally due to pain. KE 2+,. ADF/APF 4/5. RLE: 4- hf to 4+ knee and ankle. No sensory deficits.   Skin: Skin is warm and dry.  Left hip incision clean with sutures, minimal to no drainage--foam dressings in place  Psychiatric: She has a normal mood and affect. Her behavior is normal.    No results found for this or any previous visit (from the past 48 hour(s)). No results found.     Medical Problem List and Plan: 1. Functional deficits secondary to left mid shaft femur fracture 2.  DVT Prophylaxis/Anticoagulation: Pharmaceutical: Lovenox 3. Pain Management:  Patient and grandson educated on pain management--patient afraid to take narcotics.  Will continue hydrocodone prn but premedicate prior to therapy sessions. Ice left hip tid and prn 4. Mood: LCSW to follow with for evaluation and support.  5. Neuropsych: This patient is capable of making decisions on her own behalf. 6. Skin/Wound Care: Rehab RN to monitor skin and wound daily. Routine pressure relief measures.  7. Fluids/Electrolytes/Nutrition: Monitor I/O. Offer supplements if intake poor. Add protein supplement to help promote healing.  8. ABLA: Continue iron supplement. Recheck labs in am.  9. Hypokalemia: Likely dilutional. Discontinue IVF. Add oral supplement and recheck labs in am.  10 HTN: Will monitor every 8 hours. Continue Norvasc daily.  11. Constipation:  NO BM since admission. Will adjust bowel program.      Post Admission Physician Evaluation: 1. Functional deficits secondary  to  left  mid shaft femur fracture 1.  2. Patient is admitted to receive collaborative, interdisciplinary care between the physiatrist, rehab nursing staff, and therapy team. 3. Patient's level of medical complexity and substantial therapy needs in context of that medical necessity cannot be provided at a lesser intensity of care such as a SNF. 4. Patient has experienced substantial functional loss from his/her baseline which was documented  above under the "Functional History" and "Functional Status" headings.  Judging by the patient's diagnosis, physical exam, and functional history, the patient has potential for functional progress which will result in measurable gains while on inpatient rehab.  These gains will be of substantial and practical use upon discharge  in facilitating mobility and self-care at the household level. 5. Physiatrist will provide 24 hour management of medical needs as well as oversight of the therapy plan/treatment and provide guidance as appropriate regarding the interaction of the two. 6. 24 hour rehab nursing will assist with bladder management, bowel management, safety, skin/wound care, disease management, medication administration, pain management and patient education  and help integrate therapy concepts, techniques,education, etc. 7. PT will assess and treat for/with: Lower extremity strength, range of motion, stamina, balance, functional mobility, safety, adaptive techniques and equipment, ortho precautions, pain mgt, wound care.   Goals are: mod I to supervision. 8. OT will assess and treat for/with: ADL's, functional mobility, safety, upper extremity strength, adaptive techniques and equipment, pain mgt, ortho precautions, family education.   Goals are: mod I to supervision. Therapy may proceed with showering this patient. 9. SLP will assess and treat for/with: n/a.  Goals are: n/a. 10. Case Management and Social Worker will assess and treat for psychological issues and discharge planning. 11. Team conference will be held weekly to assess progress toward goals and to determine barriers to discharge. 12. Patient will receive at least 3 hours of therapy per day at least 5 days per week. 13. ELOS: 7-10 days       14. Prognosis:  excellent  PT SPEAKS NO ENGLISH---NEEDS FAMILY OR INTERPRETER PRESENT TO COMMUNICATE   Ranelle OysterZachary T. Leatha Rohner, MD, [facility] CenterFAAPMR [facility] 07/08/2014     07/08/2014

## 2014-07-08 NOTE — Progress Notes (Signed)
Pt admitted to unit at 1710 with grandson at bedside. Translator present for admission. Reviewed rehab process, booklet, and answered all pt questions at that time via translator. Grandson continues to be at bedside, call bell within reach, and pt verbalized and demonstrated use of call bell to staff.

## 2014-07-08 NOTE — Progress Notes (Signed)
Rehab admissions - I am admitting patient to acute inpatient rehab today.  Call me for questions.  #[phone]

## 2014-07-08 NOTE — Interval H&P Note (Signed)
[name] was admitted today to Inpatient Rehabilitation with the diagnosis of left femur fracture.  The patient's history has been reviewed, patient examined, and there is no change in status.  Patient continues to be appropriate for intensive inpatient rehabilitation.  I have reviewed the patient's chart and labs.  Questions were answered to the patient's satisfaction.  [name] T 07/08/2014, 7:33 PM

## 2014-07-08 NOTE — Progress Notes (Signed)
Patient ID: [name], female   DOB: [birth year], 68 y.o.   MRN: [id number] Subjective: 6 Days Post-Op Procedure(s) (LRB): ORIF FEMORAL NAIL (Left)    Patient reports pain as mild to moderate with activity, other wise OK.  No questions from her or her grandson other than timing to move to CIR  Objective:   VITALS:   Filed Vitals:   07/08/14 0510  BP: 128/56  Pulse: 79  Temp: 98.6 F (37 C)  Resp: 14    Neurovascular intact Incision: dressing C/D/I  LABS No results for input(s): HGB, HCT, WBC, PLT in the last 72 hours.  No results for input(s): NA, K, BUN, CREATININE, GLUCOSE in the last 72 hours.  No results for input(s): LABPT, INR in the last 72 hours.   Assessment/Plan: 6 Days Post-Op Procedure(s) (LRB): ORIF FEMORAL NAIL (Left)   Discharge to CIR today  Stable expected ABLA following fracture and open repair following transfusion PRBCs Follow [facility] here in [facility] Will need follow [place] with [place]

## 2014-07-08 NOTE — H&P (View-Only) (Signed)
Physical Medicine and Rehabilitation Admission H&P    Chief Complaint  Patient presents with  . Left communited displaced fracture of the midshaft of the femur   HPI:  [name] is a 68 y.o[place] female with history of HTN, hyperlipidemia; who was getting off the ladder when she fell backward with immediate onset of left leg pain. She was evaluated in ED 07/02/14 and was found to have left communited displaced fracture of the midshaft of the femur. She was evaluated by Dr. Charlann Boxerlin and underwent ORIF left mid shaft femur fracture. Post op PWB LLE and on lovenox for DVT prophylaxis. She developed ABLA with drop in hgb to 7.2 and she was transfused with one unit PRBC. She continues to have persistent hypokalemia.  Anxiety levels improving with patient showing progress as well as increased ability to maintain PWB with therapy.MD and PT recommending CIR for follow up therapy.    Review of Systems  HENT: Negative for hearing loss.   Eyes: Negative for blurred vision and double vision.  Respiratory: Negative for cough and shortness of breath.   Cardiovascular: Negative for claudication.  Gastrointestinal: Positive for constipation. Negative for nausea and vomiting.  Genitourinary: Positive for frequency. Negative for dysuria.  Musculoskeletal: Positive for joint pain. Negative for myalgias and back pain.  Neurological: Negative for dizziness and headaches.  Psychiatric/Behavioral: The patient is not nervous/anxious.       Past Medical History  Diagnosis Date  . Hypertension   . Hyperlipidemia     Past Surgical History  Procedure Laterality Date  . Orif femur fracture Left 07/02/2014    IM NAIL  . Femur im nail Left 07/02/2014    Procedure: ORIF FEMORAL NAIL;  Surgeon: Shelda PalMatthew D Olin, MD;  [facility];  Service: Orthopedics;  Laterality: Left;    Family History  Problem Relation Age of Onset  . Hypertension Father     Social History:  Lives with daughter and family in  [place]. Per reports that she has never smoked. She has never used smokeless tobacco. Per reports that she does not drink alcohol or use illicit drugs.    Allergies: No Known Allergies    Medications Prior to Admission  Medication Sig Dispense Refill  . amLODipine (NORVASC) 5 MG tablet Take 5 mg by mouth daily.    . pravastatin (PRAVACHOL) 20 MG tablet Take 20 mg by mouth daily.    . Vitamin D, Ergocalciferol, (DRISDOL) 50000 UNITS CAPS capsule Take 50,000 Units by mouth every 7 (seven) days. Mondays      Home: Home Living Family/patient expects to be discharged to:: Private residence Living Arrangements: Children, Spouse/significant other Available Help at Discharge: Family, Available 24 hours/day (grandson) Type of Home: House Home Access: Stairs to enter Secretary/administratorntrance Stairs-Number of Steps: 2 Entrance Stairs-Rails: Right Home Layout: Two level Alternate Level Stairs-Number of Steps: 10-12 Alternate Level Stairs-Rails: Right Home Equipment: None Additional Comments: daughter in law states pt. 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   Transfers Overall transfer level: Needs assistance Equipment used: Rolling walker (2 wheeled) Transfers: Sit to/from Stand Sit to Stand: Min guard Stand pivot transfers: Min assist General  transfer comment: cues for UE use especially for control with stand to sit.   Ambulation/Gait Ambulation/Gait assistance: Min guard (with physical contact) Ambulation Distance (Feet): 40 Feet Assistive device: Rolling walker (2 wheeled) Gait Pattern/deviations: Step-to  pattern, Decreased stance time - left, Decreased step length - right Gait velocity: decreased Gait velocity interpretation: Below normal speed for age/gender General Gait Details: cues for gait sequence and to unweigh LLE in stance to 50%PWB by taking half of her weight onto RW when she steps her RLE    ADL:    Cognition: Cognition Overall Cognitive Status: Within Functional Limits for tasks assessed Orientation Level: Oriented X4 Cognition Arousal/Alertness: Awake/alert Behavior During Therapy: WFL for tasks assessed/performed Overall Cognitive Status: Within Functional Limits for tasks assessed  Physical Exam: Blood pressure 128/56, pulse 79, temperature 98.6 F (37 C), temperature source Oral, resp. rate 14, height 5\' 2"  (1.575 m), weight 40.824 kg (90 lb), SpO2 99 %. Physical Exam  Nursing note and vitals reviewed. Constitutional: She is oriented to person, place, and time. She appears well-developed.  Thin female. Grandson assisted with interpretation. Understands little english   HENT:  Head: Normocephalic and atraumatic.  Right Ear: External ear normal.  Left Ear: External ear normal.  Eyes: Conjunctivae and EOM are normal. Pupils are equal, round, and reactive to light.  Neck: Normal range of motion. Neck supple. No JVD present. No tracheal deviation present. No thyromegaly present.  Cardiovascular: Normal rate and regular rhythm.  Exam reveals no friction rub.   No murmur heard. Respiratory: Effort normal and breath sounds normal. No respiratory distress. She has no wheezes. She has no rales. She exhibits no tenderness.  GI: Soft. Bowel sounds are normal. She exhibits no distension. There is no tenderness. There is no rebound and no guarding.  Musculoskeletal:  Pain with attempts at ROM left knee/left hip. Moves BUE and RLE without difficulty.   Neurological: She is alert and oriented to person, place, and time.  UES: 4+ to 5/5 bilaterally prox to distal. LLE: limited  proximally due to pain. KE 2+,. ADF/APF 4/5. RLE: 4- hf to 4+ knee and ankle. No sensory deficits.   Skin: Skin is warm and dry.  Left hip incision clean with sutures, minimal to no drainage--foam dressings in place  Psychiatric: She has a normal mood and affect. Her behavior is normal.    No results found for this or any previous visit (from the past 48 hour(s)). No results found.     Medical Problem List and Plan: 1. Functional deficits secondary to left mid shaft femur fracture 2.  DVT Prophylaxis/Anticoagulation: Pharmaceutical: Lovenox 3. Pain Management:  Patient and grandson educated on pain management--patient afraid to take narcotics.  Will continue hydrocodone prn but premedicate prior to therapy sessions. Ice left hip tid and prn 4. Mood: LCSW to follow with for evaluation and support.  5. Neuropsych: This patient is capable of making decisions on her own behalf. 6. Skin/Wound Care: Rehab RN to monitor skin and wound daily. Routine pressure relief measures.  7. Fluids/Electrolytes/Nutrition: Monitor I/O. Offer supplements if intake poor. Add protein supplement to help promote healing.  8. ABLA: Continue iron supplement. Recheck labs in am.  9. Hypokalemia: Likely dilutional. Discontinue IVF. Add oral supplement and recheck labs in am.  10 HTN: Will monitor every 8 hours. Continue Norvasc daily.  11. Constipation:  NO BM since admission. Will adjust bowel program.      Post Admission Physician Evaluation: 1. Functional deficits secondary  to  left  mid shaft femur fracture 1.  2. Patient is admitted to receive collaborative, interdisciplinary care between the physiatrist, rehab nursing staff, and therapy team. 3. Patient's level of medical complexity and substantial therapy needs in context of that medical necessity cannot be provided at a lesser intensity of care such as a SNF. 4. Patient has experienced substantial functional loss from his/her baseline which was documented  above under the "Functional History" and "Functional Status" headings.  Judging by the patient's diagnosis, physical exam, and functional history, the patient has potential for functional progress which will result in measurable gains while on inpatient rehab.  These gains will be of substantial and practical use upon discharge  in facilitating mobility and self-care at the household level. 5. Physiatrist will provide 24 hour management of medical needs as well as oversight of the therapy plan/treatment and provide guidance as appropriate regarding the interaction of the two. 6. 24 hour rehab nursing will assist with bladder management, bowel management, safety, skin/wound care, disease management, medication administration, pain management and patient education  and help integrate therapy concepts, techniques,education, etc. 7. PT will assess and treat for/with: Lower extremity strength, range of motion, stamina, balance, functional mobility, safety, adaptive techniques and equipment, ortho precautions, pain mgt, wound care.   Goals are: mod I to supervision. 8. OT will assess and treat for/with: ADL's, functional mobility, safety, upper extremity strength, adaptive techniques and equipment, pain mgt, ortho precautions, family education.   Goals are: mod I to supervision. Therapy may proceed with showering this patient. 9. SLP will assess and treat for/with: n/a.  Goals are: n/a. 10. Case Management and Social Worker will assess and treat for psychological issues and discharge planning. 11. Team conference will be held weekly to assess progress toward goals and to determine barriers to discharge. 12. Patient will receive at least 3 hours of therapy per day at least 5 days per week. 13. ELOS: 7-10 days       14. Prognosis:  excellent  PT SPEAKS NO ENGLISH---NEEDS FAMILY OR INTERPRETER PRESENT TO COMMUNICATE   Ranelle OysterZachary T. Swartz, MD, [facility] CenterFAAPMR [facility] Physical Medicine & Rehabilitation 07/08/2014     07/08/2014

## 2014-07-08 NOTE — Care Management Note (Signed)
2nd IM letter given to patient. Auther Lyerly, RN BSN Case Manager 

## 2014-07-08 NOTE — Care Management Note (Signed)
CARE MANAGEMENT NOTE 07/08/2014 07/08/14 0830 Vance PeperSusan Kathrynn Backstrom, RN BSN Case Manager Case Manager [facility], they are not able to accept the patient.

## 2014-07-09 ENCOUNTER — Inpatient Hospital Stay (HOSPITAL_COMMUNITY): Payer: Medicare Other | Admitting: Occupational Therapy

## 2014-07-09 ENCOUNTER — Inpatient Hospital Stay (HOSPITAL_COMMUNITY): Payer: Medicare Other | Admitting: Physical Therapy

## 2014-07-09 DIAGNOSIS — D62 Acute posthemorrhagic anemia: Secondary | ICD-10-CM

## 2014-07-09 DIAGNOSIS — I10 Essential (primary) hypertension: Secondary | ICD-10-CM

## 2014-07-09 DIAGNOSIS — W19XXXS Unspecified fall, sequela: Secondary | ICD-10-CM

## 2014-07-09 DIAGNOSIS — S72302S Unspecified fracture of shaft of left femur, sequela: Secondary | ICD-10-CM

## 2014-07-09 LAB — COMPREHENSIVE METABOLIC PANEL
ALBUMIN: 2.7 g/dL — AB (ref 3.5–5.2)
ALT: 125 U/L — ABNORMAL HIGH (ref 0–35)
ANION GAP: 11 (ref 5–15)
AST: 92 U/L — AB (ref 0–37)
Alkaline Phosphatase: 122 U/L — ABNORMAL HIGH (ref 39–117)
BUN: 18 mg/dL (ref 6–23)
CALCIUM: 8.7 mg/dL (ref 8.4–10.5)
CO2: 28 mEq/L (ref 19–32)
CREATININE: 0.51 mg/dL (ref 0.50–1.10)
Chloride: 103 mEq/L (ref 96–112)
GFR calc Af Amer: >90 mL/min (ref >90)
GFR calc non Af Amer: >90 mL/min (ref >90)
Glucose, Bld: 94 mg/dL (ref 70–99)
Potassium: 4.4 mEq/L (ref 3.7–5.3)
Sodium: 142 mEq/L (ref 137–147)
TOTAL PROTEIN: 6.4 g/dL (ref 6.0–8.3)
Total Bilirubin: 0.7 mg/dL (ref 0.3–1.2)

## 2014-07-09 LAB — CBC WITH DIFFERENTIAL/PLATELET
BASOS ABS: 0 10*3/uL (ref 0.0–0.1)
BASOS PCT: 0 % (ref 0–1)
EOS ABS: 0.2 10*3/uL (ref 0.0–0.7)
EOS PCT: 3 % (ref 0–5)
HCT: 31.7 % — ABNORMAL LOW (ref 36.0–46.0)
HEMOGLOBIN: 10.2 g/dL — AB (ref 12.0–15.0)
Lymphocytes Relative: 28 % (ref 12–46)
Lymphs Abs: 1.6 10*3/uL (ref 0.7–4.0)
MCH: 28.7 pg (ref 26.0–34.0)
MCHC: 32.2 g/dL (ref 30.0–36.0)
MCV: 89.3 fL (ref 78.0–100.0)
MONO ABS: 0.6 10*3/uL (ref 0.1–1.0)
MONOS PCT: 10 % (ref 3–12)
NEUTROS ABS: 3.4 10*3/uL (ref 1.7–7.7)
Neutrophils Relative %: 59 % (ref 43–77)
Platelets: 285 10*3/uL (ref 150–400)
RBC: 3.55 MIL/uL — ABNORMAL LOW (ref 3.87–5.11)
RDW: 15.1 % (ref 11.5–15.5)
WBC: 5.8 10*3/uL (ref 4.0–10.5)

## 2014-07-09 LAB — URINE CULTURE
Colony Count: NO GROWTH
Culture: NO GROWTH

## 2014-07-09 MED ORDER — AMLODIPINE BESYLATE 2.5 MG PO TABS
2.5000 mg | ORAL_TABLET | Freq: Every day | ORAL | Status: DC
  Administered 2014-07-10 – 2014-07-14 (×5): 2.5 mg via ORAL
  Filled 2014-07-09 (×7): qty 1

## 2014-07-09 MED ORDER — FERROUS SULFATE 325 (65 FE) MG PO TABS
325.0000 mg | ORAL_TABLET | Freq: Two times a day (BID) | ORAL | Status: DC
  Administered 2014-07-09 – 2014-07-14 (×11): 325 mg via ORAL
  Filled 2014-07-09 (×11): qty 1

## 2014-07-09 MED ORDER — ENOXAPARIN SODIUM 30 MG/0.3ML ~~LOC~~ SOLN
30.0000 mg | Freq: Every day | SUBCUTANEOUS | Status: DC
  Administered 2014-07-09 – 2014-07-13 (×5): 30 mg via SUBCUTANEOUS
  Filled 2014-07-09 (×6): qty 0.3

## 2014-07-09 NOTE — Evaluation (Signed)
Physical Therapy Assessment and Plan  Patient Details  Name: [name] MRN: [id number] Date of Birth: 09/14/1945  PT Diagnosis: Difficulty walking, Edema, Muscle weakness and Pain in L thigh/femur Rehab Potential: Excellent ELOS: 5-7 days   Today's Date: 07/09/2014 PT Individual Time: 2130-3974 PT Individual Time Calculation (min): 90 min    Problem List:  Patient Active Problem List   Diagnosis Date Noted  . Postoperative anemia due to acute blood loss 07/08/2014  . Essential hypertension 07/08/2014  . Fall 07/08/2014  . Expected blood loss anemia 07/03/2014  . Fracture of shaft of left femur 07/02/2014    Past Medical History:  Past Medical History  Diagnosis Date  . Hypertension   . Hyperlipidemia    Past Surgical History:  Past Surgical History  Procedure Laterality Date  . Orif femur fracture Left 07/02/2014    IM NAIL  . Femur im nail Left 07/02/2014    Procedure: ORIF FEMORAL NAIL;  Surgeon: Shelda Pal, MD;  Location: [facility] OR;  Service: Orthopedics;  Laterality: Left;    Assessment & Plan Clinical Impression: Patient is a 68 y.o. [name] female with history of HTN, hyperlipidemia; who was getting off the ladder when she fell backward with immediate onset of left leg pain. She was evaluated in ED 07/02/14 and was found to have left communited displaced fracture of the midshaft of the femur. She was evaluated by Dr. Charlann Boxer and underwent ORIF left mid shaft femur fracture. Post op PWB LLE and on lovenox for DVT prophylaxis. She developed ABLA with drop in hgb to 7.2 and she was transfused with one unit PRBC. She continues to have persistent hypokalemia. Anxiety levels improving with patient showing progress as well as increased ability to maintain PWB with therapy.  Patient transferred to CIR on 07/08/2014 .   Patient currently requires min-mod A with mobility secondary to muscle weakness and pain in L thigh/femur and decreased standing balance.  Prior to  hospitalization, patient was independent  with mobility and lived with Daughter, Family in a House home.  Home access is 2Stairs to enter.  Patient will benefit from skilled PT intervention to maximize safe functional mobility, minimize fall risk and decrease caregiver burden for planned discharge home with 24 hour supervision.  Anticipate patient will benefit from follow up HH at discharge.  PT - End of Session Activity Tolerance: Decreased this session Endurance Deficit: Yes Endurance Deficit Description: pain PT Assessment Rehab Potential (ACUTE/IP ONLY): Excellent Barriers to Discharge: Inaccessible home environment (stairs with no rail to enter/exit home) PT Patient demonstrates impairments in the following area(s): Balance;Edema;Endurance;Motor;Pain PT Transfers Functional Problem(s): Bed Mobility;Bed to Chair;Car;Furniture PT Locomotion Functional Problem(s): Ambulation;Stairs;Wheelchair Mobility PT Plan PT Intensity: Minimum of 1-2 x/day ,45 to 90 minutes PT Frequency: 5 out of 7 days PT Duration Estimated Length of Stay: 5-7 days PT Treatment/Interventions: Ambulation/gait training;Balance/vestibular training;Discharge planning;DME/adaptive equipment instruction;Functional mobility training;Pain management;Patient/family education;Stair training;Therapeutic Activities;Therapeutic Exercise;UE/LE Strength taining/ROM;Wheelchair propulsion/positioning PT Transfers Anticipated Outcome(s): Mod I PT Locomotion Anticipated Outcome(s): Mod I PT Recommendation Follow Up Recommendations: Home health PT Patient destination: Home Equipment Recommended: Wheelchair (measurements);Wheelchair cushion (measurements);Rolling walker with 5" wheels  Skilled Therapeutic Intervention Pt received following OT B&D.  Pt BP assessed and low BP noted (90/40).  RN and MD notified; advised donning KHT and MD to order abdominal binder.  Pt performed PT evaluation plus bed mobility and stair training (see below  in appropriate sections) and then transitioned to supine on mat to perform LE strengthening and PROM exercises. Performed  LLE glute/quad sets, hip IR, hip flexion, hip ABD, SAQ, heel slides x 12 reps each. Pt reporting 8/10 pain with SAQ due to stretching L quad muscle and pt given prolong rest break with cues for deep breathing secondary to pt reporting dizziness and hyperventilating; RN notified for pain medication.  Adjusted pt RW down to appropriate height and cushion placed in w/c.  Returned to room and pt set up with all items within reach for lunch.  PT Evaluation Precautions/Restrictions Precautions Precautions: Fall Restrictions LLE Weight Bearing: Partial weight bearing LLE Partial Weight Bearing Percentage or Pounds: 50% General Chart Reviewed: Yes Response to Previous Treatment: Patient with no complaints from previous session. Family/Caregiver Present: Yes   Vital SignsTherapy Vitals Temp: 98.3 F (36.8 C) Temp Source: Oral Pulse Rate: 86 Resp: 18 BP: (!) 116/53 mmHg Patient Position (if appropriate): Lying Oxygen Therapy SpO2: 100 % O2 Device: Not Delivered Pain  initially 4/10 at rest but while performing LE AROM/PROM pt pain increased to 8/10; RN notified for pain medication Home Living/Prior [place] Arrangements: Children;Other relatives Vision/Perception  Vision - Assessment Eye Alignment: Within Functional Limits  Cognition Overall Cognitive Status: Within Functional Limits for tasks assessed Orientation Level: Oriented X4 Sensation Sensation Light Touch: Appears Intact Stereognosis: Appears Intact Hot/Cold: Appears Intact Proprioception: Appears Intact Coordination Gross Motor Movements are Fluid and Coordinated: Yes Motor  Motor Motor: Other (comment) Motor - Skilled Clinical Observations: generalized weakness in left LE  Mobility Bed Mobility Bed Mobility: Supine to Sit;Sit to Supine Supine to Sit: 3: Mod assist;HOB  flat Sit to Supine: 3: Mod assist;HOB flat Sit to Supine - Details (indicate cue type and reason): Performed supine <> sit on flat mat, no rails to simulate home bed.  Pt typically sits on foot of bed and scoots up to [facility] to enter; Performed with mod A to assist LLE onto bed and lift while scooting.  Discussed with grandson who says they will be moving her bed down to main level where she can enter/exit from the side.  Performed bed mobility training from side of bed; pt continued to require min-mod A to bring LLE onto bed and assistance to lift trunk from supine position due to pain in L hip. Transfers Transfers: Yes Sit to Stand: 4: Min assist Stand to Sit: 4: Min assist Stand Pivot Transfers: 4: Min Psychologist, occupational Details (indicate cue type and reason): Performed stand pivot with min A with RW for PWB with visual cues for sequencing Locomotion  Ambulation Ambulation/Gait Assistance: 4: Min assist Ambulation Distance (Feet): 25 Feet Ambulation/Gait Assistance Details: Performed gait x 25' x 2 with RW and min A with pt demonstrating good attention to [facility] with step to gait sequence Stairs / Additional Locomotion Stairs: Yes Stairs Assistance: 3: Mod assist Stairs Assistance Details (indicate cue type and reason): Performed up/down 3 stairs with RW (pt does not have rails for home entry/exit); pt required total verbal cues for sequencing and mod A for stability of LLE during stance when ascending with RLE.   Stair Management Technique: No rails;Forwards;With walker Number of Stairs: 3 Height of Stairs: 4 Wheelchair Mobility Wheelchair Mobility: Yes Wheelchair Assistance: 3: Mod Lexicographer: Both upper extremities Wheelchair Parts Management: Needs assistance Distance: 150 with mod A for sequencing forwards and changes in direction  Trunk/Postural Assessment  Cervical Assessment Cervical Assessment: Within Functional Limits Thoracic Assessment Thoracic  Assessment: Within Functional Limits Lumbar Assessment Lumbar Assessment: Within Functional Limits Postural Control Postural Control: Within  Functional Limits  Balance Balance Balance Assessed: Yes Dynamic Sitting Balance Dynamic Sitting - Level of Assistance: 5: Stand by assistance Static Standing Balance Static Standing - Level of Assistance: 4: Min assist Dynamic Standing Balance Dynamic Standing - Level of Assistance: 4: Min assist Extremity Assessment  RLE Assessment RLE Assessment: Within Functional Limits LLE Assessment LLE Assessment: Exceptions to WFL LLE Strength LLE Overall Strength: Deficits;Due to pain LLE Overall Strength Comments: 4-/5 overall   FIM:  FIM - Bed/Chair Transfer Bed/Chair Transfer Assistive Devices: Adult nurse Transfer: 3: Supine > Sit: Mod A (lifting assist/Pt. 50-74%/lift 2 legs;4: Sit > Supine: Min A (steadying pt. > 75%/lift 1 leg);4: Bed > Chair or W/C: Min A (steadying Pt. > 75%);4: Chair or W/C > Bed: Min A (steadying Pt. > 75%) FIM - Locomotion: Wheelchair Distance: 150 with mod A for sequencing forwards and changes in direction Locomotion: Wheelchair: 3: Travels 150 ft or more: maneuvers on rugs and over door sills with moderate assistance  (Pt: 50 - 74%) FIM - Locomotion: Ambulation Locomotion: Ambulation Assistive Devices: Administrator Ambulation/Gait Assistance: 4: Min assist Locomotion: Ambulation: 1: Travels less than 50 ft with minimal assistance (Pt.>75%) FIM - Locomotion: Stairs Locomotion: Scientist, physiological: Publishing copy: Stairs: 1: Up and Down < 4 stairs with moderate assistance (Pt: 50 - 74%)   Refer to Care Plan for Long Term Goals  Recommendations for other services: None  Discharge Criteria: Patient will be discharged from PT if patient refuses treatment 3 consecutive times without medical reason, if treatment goals not met, if there is a change in medical status, if patient makes no progress towards  goals or if patient is discharged from hospital.  The above assessment, treatment plan, treatment alternatives and goals were discussed and mutually agreed upon: by patient and by family  Malachy Mood 07/09/2014, 5:12 PM

## 2014-07-09 NOTE — Progress Notes (Signed)
Social Work Patient ID: [name], female   DOB: 05-21-1946, 68 y.o.   MRN: [id number] Met with pt and grandson to inform of team conference goals-mod/i level and discharge 11/23.  Both very pleased with her progress and the discharge date. Will work toward this date and assist with discharge planning needs.

## 2014-07-09 NOTE — Progress Notes (Signed)
Patient information reviewed and entered into eRehab system by Fredna Stricker, RN, CRRN, PPS Coordinator.  Information including medical coding and functional independence measure will be reviewed and updated through discharge.     Per nursing patient was given "Data Collection Information Summary for Patients in Inpatient Rehabilitation Facilities with attached "Privacy Act Statement-Health Care Records" upon admission.  

## 2014-07-09 NOTE — Progress Notes (Signed)
Orthopedic Tech Progress Note Patient Details:  [name] 04-18-1946 [id number]  Ortho Devices Type of Ortho Device: Abdominal binder Ortho Device/Splint Location: abdomen Ortho Device/Splint Interventions: Freeman CaldronOrdered   [name] 07/09/2014, 1:16 PM

## 2014-07-09 NOTE — Progress Notes (Signed)
Physical Therapy Session Note  Patient Details  Name: [name] MRN: [id number] Date of Birth: May 16, 1946  Today's Date: 07/09/2014 PT Individual Time: [id number] PT Individual Time Calculation (min): 32 min   Short Term Goals: Week 1:  PT Short Term Goal 1 (Week 1): = LTG secondary to short LOS  Skilled Therapeutic Interventions/Progress Updates:  Pt received semi reclined in bed accompanied by grandson; agreeable to therapy. Session focused on activity tolerance, safe use of assistive device, adherence to precautions, and bed mobility. Pt performed w/c mobility x150' in controlled and home environments with bilat UE's requiring increased time and min Cruz to negotiate door sill. In rehab apartment, pt performed sit<>supine on standard bed initially approaching from foot of bed (to simulate pt preference, home setup prior to admission) with supervision and verbal/demonstration cueing for technique while adhering to LLE PWB. Subsequent trial of supine<>sit performed from side of bed with supervision, increased time. Transitioned to gait x130' in controlled environment with rolling walker and min guard, verbal/demonstration cueing for adherence to WB restrictions with effective return demonstration. Session ended in pt room, where pt was left semi reclined in bed with 3 rails up, bed alarm on, and all needs within reach.  Therapy Documentation Precautions:  Precautions Precautions: Fall Restrictions Weight Bearing Restrictions: Yes LLE Weight Bearing: Partial weight bearing LLE Partial Weight Bearing Percentage or Pounds: 50% Vital Signs: Therapy Vitals Temp: 98.3 F (36.8 C) Temp Source: Oral Pulse Rate: 86 Resp: 18 BP: (!) 116/53 mmHg Patient Position (if appropriate): Lying Oxygen Therapy SpO2: 100 % O2 Device: Not Delivered Pain: Pain Assessment Pain Assessment: 0-10 Pain Score: 4  Pain Type: Surgical pain Pain Location: Leg Pain Orientation: Left Pain Descriptors /  Indicators: Aching Pain Onset: On-going Pain Intervention(s): Ambulation/increased activity Mobility: Bed Mobility Bed Mobility: Supine to Sit;Sit to Supine Supine to Sit: 3: Mod assist;HOB flat Sit to Supine: 3: Mod assist;HOB flat Sit to Supine - Details (indicate cue type and reason): Performed supine <> sit on flat mat, no rails to simulate home bed.  Pt typically sits on foot of bed and [facility]s [facility]; Performed with mod Cruz to assist LLE onto bed and lift while scooting.  Discussed with grandson who says they will be moving her bed down to main level where she can enter/exit from the side.  Performed bed mobility training from side of bed; pt continued to require min-mod Cruz to bring LLE onto bed and assistance to lift trunk from supine position due to pain in L hip. Transfers Transfers: Yes Sit to Stand: 4: Min assist Stand to Sit: 4: Min assist Stand Pivot Transfers: 4: Min Actuaryassist Stand Pivot Transfer Details (indicate cue type and reason): Performed stand pivot with min Cruz with RW for PWB with visual cues for sequencing Locomotion : Ambulation Ambulation/Gait Assistance: 4: Min guard Ambulation Distance (Feet): 25 Feet Ambulation/Gait Assistance Details: Performed gait x 25' x 2 with RW and min Cruz with pt demonstrating good attention to [facility] with step to gait sequence Stairs / Additional Locomotion Stairs: Yes Stairs Assistance: 3: Mod assist Stairs Assistance Details (indicate cue type and reason): Performed up/down 3 stairs with RW (pt does not have rails for home entry/exit); pt required total verbal cues for sequencing and mod Cruz for stability of LLE during stance when ascending with RLE.   Stair Management Technique: No rails;Forwards;With walker Number of Stairs: 3 Height of Stairs: 4 Wheelchair Mobility Wheelchair Mobility: Yes Wheelchair Assistance: 3: Multimedia programmerMod assist Wheelchair Propulsion:  Both upper extremities Wheelchair Parts Management: Needs assistance Distance:  150  Trunk/Postural Assessment : Cervical Assessment Cervical Assessment: Within Functional Limits Thoracic Assessment Thoracic Assessment: Within Functional Limits Lumbar Assessment Lumbar Assessment: Within Functional Limits Postural Control Postural Control: Within Functional Limits  Balance: Balance Balance Assessed: Yes Dynamic Sitting Balance Dynamic Sitting - Level of Assistance: 5: Stand by assistance Static Standing Balance Static Standing - Level of Assistance: 4: Min assist Dynamic Standing Balance Dynamic Standing - Level of Assistance: 4: Min assist  See FIM for current functional status  Therapy/Group: Individual Therapy  Karla CantorHobble, [name] 07/09/2014, 6:19 PM

## 2014-07-09 NOTE — Plan of Care (Signed)
Problem: RH SAFETY Goal: RH STG ADHERE TO SAFETY PRECAUTIONS W/ASSISTANCE/DEVICE STG Adhere to Safety Precautions With supervision Assistance/Device.  Outcome: Progressing     

## 2014-07-09 NOTE — Plan of Care (Signed)
Problem: RH SKIN INTEGRITY Goal: RH STG SKIN FREE OF INFECTION/BREAKDOWN With min.assist  Outcome: Progressing     

## 2014-07-09 NOTE — Plan of Care (Signed)
Problem: RH SKIN INTEGRITY Goal: RH STG ABLE TO PERFORM INCISION/WOUND CARE W/ASSISTANCE STG Able To Perform Incision/Wound Care With Min Assistance.  Outcome: Progressing     

## 2014-07-09 NOTE — Progress Notes (Signed)
Physical Medicine and Rehabilitation Consult  Reason for Consult: Left communited displaced fracture of the midshaft of the femur Referring Physician: Dr. Charlann Boxerlin    HPI: [name] is a 68 y.o. [place] female with history of HTN, hyperlipidemia; who was getting off the ladder when she fell backward with immediate onset of left leg pain. She was evaluated in ED 07/02/14 and was found to have left communited displaced fracture of the midshaft of the femur. She was evaluated by Dr. Charlann Boxerlin and underwent ORIF left mid shaft femur fracture. Post op PWB LLE and on lovenox for DVT prophylaxis. PT evaluation done yesterday and patient noted to have difficulty maintaining WB precautions as well as fear limiting mobility. MD and PT recommending CIR for follow up therapy.    Review of Systems  HENT: Negative for hearing loss.  Eyes: Negative for blurred vision and double vision.  Respiratory: Negative for cough and shortness of breath.  Cardiovascular: Negative for chest pain and palpitations.  Gastrointestinal: Positive for constipation (No BM since admissiton. ). Negative for heartburn, nausea and abdominal pain.  Musculoskeletal: Positive for joint pain. Negative for myalgias.  Neurological: Negative for dizziness, tingling and headaches.     Past Medical History  Diagnosis Date  . Hypertension   . Hyperlipidemia    Past Surgical History  Procedure Laterality Date  . Orif femur fracture Left 07/02/2014    IM NAIL  . Femur im nail Left 07/02/2014    Procedure: ORIF FEMORAL NAIL; Surgeon: Shelda PalMatthew D Olin, MD; [facility]; Service: Orthopedics; Laterality: Left;    Family History  Problem Relation Age of Onset  . Hypertension Father       Social History: Lives with daughter and family. Per reports that she has never smoked. She has never used smokeless tobacco. Per reports that she does not drink alcohol or use illicit drugs.     Allergies: No Known Allergies    Medications Prior to Admission  Medication Sig Dispense Refill  . amLODipine (NORVASC) 5 MG tablet Take 5 mg by mouth daily.    . pravastatin (PRAVACHOL) 20 MG tablet Take 20 mg by mouth daily.    . Vitamin D, Ergocalciferol, (DRISDOL) 50000 UNITS CAPS capsule Take 50,000 Units by mouth every 7 (seven) days. Mondays      Home: Home Living Family/patient expects to be discharged to:: Private residence Living Arrangements: Children, Spouse/significant other Available Help at Discharge: Family, Available 24 hours/day (grandson) Type of Home: House Home Access: Stairs to enter Secretary/administratorntrance Stairs-Number of Steps: 2 Entrance Stairs-Rails: Right Home Layout: Two level Alternate Level Stairs-Number of Steps: 10-12 Alternate Level Stairs-Rails: Right Home Equipment: None Additional Comments: daughter in law states pt. 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 indicated to her daughter in law that she was afraid. After careful explanation to daughter in llaw who interpreted to pt., she was assisted to EOB with +2 mod assist at hips and shoulders. Feel pt. was somewhat limited in her ability to her fear Transfers Overall transfer level: Needs assistance Equipment used: None, Rolling walker (2 wheeled) Transfers: Sit to/from Stand, Stand Pivot Transfers Sit to Stand: +2 physical assistance, Mod assist Stand  pivot transfers: +2 physical assistance, Mod assist General transfer comment: Pt. indicated she needed to use the bathroom. Pt. assisted to 3 n 1 with +2 mod  assist for stand pivot transfer then with RW to recliner with same assist level. Pt. needed support under L foot to assure PWB status. Pt. safely positioned in recliner chair following transfer Ambulation/Gait Ambulation/Gait assistance: (TBA)    ADL:    Cognition: Cognition Overall Cognitive Status: Within Functional Limits for tasks assessed Orientation Level: Oriented to situation, Oriented to person, Oriented to place Cognition Arousal/Alertness: Awake/alert Behavior During Therapy: [facility]FL for tasks assessed/performed Overall Cognitive Status: Within Functional Limits for tasks assessed  Blood pressure 114/47, pulse 81, temperature 98.5 F (36.9 C), resp. rate 16, height 5\' 2"  (1.575 m), weight 40.824 kg (90 lb), SpO2 100 %. Physical Exam  Nursing note and vitals reviewed. Constitutional: She appears well-developed.  Thin female. Grandson in room assisted with translation.  HENT:  Head: Normocephalic and atraumatic.  Eyes: Conjunctivae are normal. Pupils are equal, round, and reactive to light.  Neck: Normal range of motion. Neck supple.  Cardiovascular: Normal rate and regular rhythm.  Respiratory: Effort normal and breath sounds normal. No respiratory distress. She has no wheezes.  GI: Bowel sounds are normal. She exhibits no distension. There is no tenderness.  Musculoskeletal:  Moves BUE and RLE without difficulty. Pain with attempts at ROM left knee. PF/DF intact bilaterally.  Neurological: She is alert.  Pleasant and appropriate. Followed commands without difficulty.  Skin: Skin is warm and dry.  Moderate edema left thigh. Surgical dressings in place.  Psychiatric: She has a normal mood and affect. Her speech is normal and behavior is normal.     Lab Results Last 24 Hours    Results for orders placed or performed during the hospital encounter of 07/02/14 (from the past 24 hour(s))  CBC Status: Abnormal   Collection Time: 07/04/14 4:37 AM  Result  Value Ref Range   WBC 5.5 4.0 - 10.5 K/uL   RBC 2.45 (L) 3.87 - 5.11 MIL/uL   Hemoglobin 7.2 (L) 12.0 - 15.0 g/dL   HCT 60.422.5 (L) 54.036.0 - 98.146.0 %   MCV 91.8 78.0 - 100.0 fL   MCH 29.4 26.0 - 34.0 pg   MCHC 32.0 30.0 - 36.0 g/dL   RDW 19.113.5 47.811.5 - 29.515.5 %   Platelets 145 (L) 150 - 400 K/uL  Basic metabolic panel Status: Abnormal   Collection Time: 07/04/14 4:37 AM  Result Value Ref Range   Sodium 144 137 - 147 mEq/L   Potassium 3.5 (L) 3.7 - 5.3 mEq/L   Chloride 108 96 - 112 mEq/L   CO2 27 19 - 32 mEq/L   Glucose, Bld 99 70 - 99 mg/dL   BUN 10 6 - 23 mg/dL   Creatinine, Ser 6.210.53 0.50 - 1.10 mg/dL   Calcium 7.4 (L) 8.4 - 10.5 mg/dL   GFR calc non Af Amer >90 >90 mL/min   GFR calc Af Amer >90 >90 mL/min   Anion gap 9 5 - 15      Imaging Results (Last 48 hours)    Dg Femur Left  07/02/2014 CLINICAL DATA: ORIF of a left femur fracture EXAM: LEFT FEMUR - 2 VIEW; DG C-ARM 61-120 MIN COMPARISON: Radiography from earlier the same day FINDINGS: Fluoroscopy shows placement of an antegrade intra medullary femoral nail for fixation of a comminuted diaphysis fracture. There is anatomic alignment of the diaphysis, with similar displacement of a isolated posterior cortex  fragment. No new fracture. IMPRESSION: Left femur diaphysis fracture ORIF. Electronically Signed By: Tiburcio Pea M.D. On: 07/02/2014 19:08   Dg Pelvis Portable  07/02/2014 CLINICAL DATA: Patient fell approximately 3 feet EXAM: PORTABLE PELVIS 1-2 VIEWS COMPARISON: None. FINDINGS: There is overlying metallic hardware on the left. There is no apparent fracture or dislocation. There is mild symmetric narrowing of both hip joints. No erosive change. IMPRESSION: No demonstrable fracture or dislocation. Electronically Signed By: Bretta Bang M.D. On: 07/02/2014 13:01   Dg Chest Port 1 View  07/02/2014 CLINICAL  DATA: Patient fell approximately 3 feet EXAM: PORTABLE CHEST - 1 VIEW COMPARISON: None. FINDINGS: There is an area of calcification either in or overlying the right apex. Elsewhere lungs are clear. Heart is upper normal in size with pulmonary vascularity within normal limits. No pneumothorax. No adenopathy. No fractures are apparent. There is a small bone island in the proximal right humerus. IMPRESSION: No edema or consolidation. No apparent pneumothorax. Electronically Signed By: Bretta Bang M.D. On: 07/02/2014 12:59   Dg Femur Left Port  07/02/2014 CLINICAL DATA: Trauma. Fall from a 3 foot height. Hypertension. EXAM: PORTABLE LEFT FEMUR - 2 VIEW COMPARISON: None. FINDINGS: Comminuted displaced fracture of the midshaft of the femur noted with a intermediary butterfly fragment displaced posteriorly 1.7 cm from the proximal fragment, and with the distal fragment posteriorly displaced about 3 cm from the proximal fragment. 11 degrees of angulation between the main proximal fragment in the main distal fragment. IMPRESSION: 1. Mildly comminuted midshaft femur fracture, moderately displaced as detailed above. Electronically Signed By: Herbie Baltimore M.D. On: 07/02/2014 13:01   Dg C-arm 61-120 Min  07/02/2014 CLINICAL DATA: ORIF of a left femur fracture EXAM: LEFT FEMUR - 2 VIEW; DG C-ARM 61-120 MIN COMPARISON: Radiography from earlier the same day FINDINGS: Fluoroscopy shows placement of an antegrade intra medullary femoral nail for fixation of a comminuted diaphysis fracture. There is anatomic alignment of the diaphysis, with similar displacement of a isolated posterior cortex fragment. No new fracture. IMPRESSION: Left femur diaphysis fracture ORIF. Electronically Signed By: Tiburcio Pea M.D. On: 07/02/2014 19:08     Assessment/Plan: Diagnosis: left mid shaft femur fracture 1. Does the need for close, 24 hr/day medical supervision in concert with the  patient's rehab needs make it unreasonable for this patient to be served in a less intensive setting? Yes 2. Co-Morbidities requiring supervision/potential complications: abla 3. Due to bladder management, bowel management, safety, skin/wound care, disease management, medication administration, pain management and patient education, does the patient require 24 hr/day rehab nursing? Potentially 4. Does the patient require coordinated care of a physician, rehab nurse, PT (1-2 hrs/day, 5 days/week) and OT (1-2 hrs/day, 5 days/week) to address physical and functional deficits in the context of the above medical diagnosis(es)? Yes and Potentially Addressing deficits in the following areas: balance, endurance, locomotion, strength, transferring, bowel/bladder control, bathing, dressing, feeding, grooming, toileting, swallowing and psychosocial support 5. Can the patient actively participate in an intensive therapy program of at least 3 hrs of therapy per day at least 5 days per week? Potentially 6. The potential for patient to make measurable gains while on inpatient rehab is good 7. Anticipated functional outcomes upon discharge from inpatient rehab are modified independent with PT, modified independent with OT, n/a with SLP. 8. Estimated rehab length of stay to reach the above functional goals is: 7 days? 9. Does the patient have adequate social supports and living environment to accommodate these discharge functional goals? Yes 10. Anticipated D/C setting: Home  11. Anticipated post D/C treatments: HH therapy 12. Overall Rehab/Functional Prognosis: excellent  RECOMMENDATIONS: This patient's condition is appropriate for continued rehabilitative care in the following setting: potentially CIR Patient has agreed to participate in recommended program. Yes Note that insurance prior authorization may be required for reimbursement for recommended care.  Comment: Rehab Admissions Coordinator to follow  up.  Thanks,  Ranelle Oyster, MD, [facility]     07/04/2014       Revision History

## 2014-07-09 NOTE — Care Management Note (Signed)
[facility] Individual Statement of Services  Patient Name:  [name]  Date:  07/09/2014  Welcome to the [facility].  Our goal is to provide you with an individualized program based on your diagnosis and situation, designed to meet your specific needs.  With this comprehensive rehabilitation program, you will be expected to participate in at least 3 hours of rehabilitation therapies Monday-Friday, with modified therapy programming on the weekends.  Your rehabilitation program will include the following services:  Physical Therapy (PT), Occupational Therapy (OT), 24 hour per day rehabilitation nursing, Case Management (Social Worker), Rehabilitation Medicine, Nutrition Services and Pharmacy Services  Weekly team conferences will be held on Wednesday to discuss your progress.  Your Social Worker will talk with you frequently to get your input and to update you on team discussions.  Team conferences with you and your family in attendance may also be held.  Expected length of stay: 7 days  Overall anticipated outcome: mod/i level  Depending on your progress and recovery, your program may change. Your Social Worker will coordinate services and will keep you informed of any changes. Your Social Worker's name and contact numbers are listed  below.  The following services may also be recommended but are not provided by the [facility]:    Home Health Rehabiltiation Services  Outpatient Rehabilitation Services    Arrangements will be made to provide these services after discharge if needed.  Arrangements include referral to agencies that provide these services.  Your insurance has been verified to be:  Medicare Your primary doctor is:  PCP in [place]  Pertinent information will be shared with your doctor and your insurance company.  Social Worker:  Dossie DerBecky Osman [place], [address] [phone] or ([phone]) [phone]  Information discussed with and copy  given to patient by: [name], Brittannie Tawney G, 07/09/2014, 1:28 PM

## 2014-07-09 NOTE — Evaluation (Signed)
Occupational Therapy Assessment and Plan  Patient Details  Name: [name] MRN: [id number] Date of Birth: July 11, 1946  OT Diagnosis: acute pain and muscle weakness (generalized) Rehab Potential: Rehab Potential (ACUTE ONLY): Good ELOS: 5-7 days   Today's Date: 07/09/2014 OT Individual Time: 0900-1000 OT Individual Time Calculation (min): 60 min     Problem List:  Patient Active Problem List   Diagnosis Date Noted  . Postoperative anemia due to acute blood loss 07/08/2014  . Essential hypertension 07/08/2014  . Fall 07/08/2014  . Expected blood loss anemia 07/03/2014  . Fracture of shaft of left femur 07/02/2014    Past Medical History:  Past Medical History  Diagnosis Date  . Hypertension   . Hyperlipidemia    Past Surgical History:  Past Surgical History  Procedure Laterality Date  . Orif femur fracture Left 07/02/2014    IM NAIL  . Femur im nail Left 07/02/2014    Procedure: ORIF FEMORAL NAIL;  Surgeon: Mauri Pole, MD;  Location: [facility];  Service: Orthopedics;  Laterality: Left;    Assessment & Plan Clinical Impression: Patient is a 68 y.o. year old female [place] female with history of HTN, hyperlipidemia; who was getting off the ladder when she fell backward with immediate onset of left leg pain. She was evaluated in ED 07/02/14 and was found to have left communited displaced fracture of the midshaft of the femur. She was evaluated by Dr. Alvan Dame and underwent ORIF left mid shaft femur fracture. Post op PWB LLE and on lovenox for DVT prophylaxis. She developed ABLA with drop in hgb to 7.2 and she was transfused with one unit PRBC. She continues to have persistent hypokalemia. Anxiety levels improving with patient showing progress as well as increased ability to maintain PWB with therapy. Patient transferred to CIR on 07/08/2014 .    Patient currently requires min with basic self-care skills secondary to muscle weakness and acute pain, decreased cardiorespiratoy  endurance,  and decreased standing balance, decreased balance strategies, difficulty maintaining precautions and knowledge of AE/DME and how to mantain precautions.  Prior to hospitalization, patient could complete ADL with independent .  Patient will benefit from skilled intervention to decrease level of assist with basic self-care skills and increase independence with basic self-care skills prior to discharge home with care partner.  Anticipate patient will require A with IADL but anticipate mod I with basic ADLs. and no further OT follow recommended.  OT - End of Session Activity Tolerance: Improving Endurance Deficit: Yes OT Assessment Rehab Potential (ACUTE ONLY): Good OT Patient demonstrates impairments in the following area(s): Balance;Edema;Endurance;Pain;Motor;Skin Integrity OT Basic ADL's Functional Problem(s): Grooming;Bathing;Dressing;Toileting OT Advanced ADL's Functional Problem(s): Simple Meal Preparation OT Transfers Functional Problem(s): Toilet;Tub/Shower OT Additional Impairment(s): None OT Plan OT Intensity: Minimum of 1-2 x/day, 45 to 90 minutes OT Frequency: 5 out of 7 days OT Duration/Estimated Length of Stay: 5-7 days OT Treatment/Interventions: Balance/vestibular training;Community reintegration;Discharge planning;DME/adaptive equipment instruction;Patient/family education;Self Care/advanced ADL retraining;Therapeutic Exercise;UE/LE Coordination activities;Wheelchair propulsion/positioning;UE/LE Strength taining/ROM;Therapeutic Activities;Skin care/wound managment;Pain management;Functional mobility training OT Self Feeding Anticipated Outcome(s): n/a OT Basic Self-Care Anticipated Outcome(s): mod I OT Toileting Anticipated Outcome(s): mod I  OT Bathroom Transfers Anticipated Outcome(s): mod I  OT Recommendation Patient destination: Home Follow Up Recommendations: None Equipment Recommended: To be determined   Skilled Therapeutic Intervention 1:1 OT eval  initiated with OT goals, purpose and role discussed with pt and pt's grandson.  Self care retraining at shower level with focus on bed mobility, basic stand pivot transfers with RW  with cues for maintaining weight bearing precautions bed<w/c<>toilet<>shower stall, sit to stands, standing balance with UE and without UE support, LB dressing, activity tolerance. Pt expresses goal to get back to cooking in the kitchen at home. Discussed/ education on precautions and therapy anticipates her to need to continue to use RW at home after d/c. Pt able to transfer to different surfaces with min A with RW.  Showed and demonstrated shower transfer bench as an option if pt was able to get up the stair at home but recommending waiting until closer to d/c to determine DME needs   Interpreter not available for evaluation - grandson assisted with communication due to language barrier.    OT Evaluation Precautions/Restrictions  Precautions Precautions: Fall Restrictions Weight Bearing Restrictions: Yes LLE Partial Weight Bearing Percentage or Pounds: 50% General Chart Reviewed: Yes Family/Caregiver Present: Yes (grandson) Vital Signs Therapy Vitals BP: (!) 110/44 mmHg Pain Pain Assessment Pain Score: 4  Pain Location: Leg Pain Orientation: Left Pain Descriptors / Indicators: Aching Pain Onset: On-going Home Living/Prior Functioning Home Living Available Help at Discharge: Family, Available 24 hours/day Type of Home: House Home Access: Stairs to enter Technical brewer of Steps: 2 Home Layout: Two level, Able to live on main level with bedroom/bathroom Alternate Level Stairs-Number of Steps: 10-12  Lives With: Daughter, Family ADL   Vision/Perception  Vision- History Baseline Vision/History: Wears glasses Vision- Assessment Vision Assessment?: No apparent visual deficits Eye Alignment: Within Functional Limits  Cognition Overall Cognitive Status: Within Functional Limits for tasks  assessed Orientation Level: Oriented X4 Safety/Judgment: Appears intact Sensation Sensation Light Touch: Appears Intact Stereognosis: Appears Intact Hot/Cold: Appears Intact Proprioception: Appears Intact Coordination Gross Motor Movements are Fluid and Coordinated: Yes Fine Motor Movements are Fluid and Coordinated: Yes Motor  Motor Motor - Skilled Clinical Observations: generalized weakness in left LE Mobility  Bed Mobility Bed Mobility: Supine to Sit Supine to Sit: 5: Supervision Transfers Transfers: Sit to Stand;Stand to Sit Sit to Stand: 4: Min assist Stand to Sit: 4: Min assist  Trunk/Postural Assessment  Cervical Assessment Cervical Assessment: Within Functional Limits Thoracic Assessment Thoracic Assessment: Within Functional Limits Lumbar Assessment Lumbar Assessment: Within Functional Limits Postural Control Postural Control: Within Functional Limits  Balance Balance Balance Assessed: Yes Dynamic Sitting Balance Dynamic Sitting - Balance Support: During functional activity Dynamic Sitting - Level of Assistance: 5: Stand by assistance Static Standing Balance Static Standing - Level of Assistance: 4: Min assist Dynamic Standing Balance Dynamic Standing - Balance Support: During functional activity Dynamic Standing - Level of Assistance: 4: Min assist Extremity/Trunk Assessment RUE Assessment RUE Assessment: Within Functional Limits LUE Assessment LUE Assessment: Within Functional Limits  FIM:  FIM - Grooming Grooming Steps: Wash, rinse, dry face;Wash, rinse, dry hands;Oral care, brush teeth, clean dentures;Brush, comb hair Grooming: 5: Set-up assist to obtain items FIM - Bathing Bathing Steps Patient Completed: Chest;Right Arm;Left Arm;Abdomen;Front perineal area;Buttocks;Right upper leg;Left upper leg;Right lower leg (including foot) Bathing: 4: Min-Patient completes 8-9 [id number] parts or 75+ percent FIM - Upper Body Dressing/Undressing Upper body  dressing/undressing steps patient completed: Put head through opening of pull over shirt/dress;Thread/unthread left sleeve of pullover shirt/dress;Thread/unthread right sleeve of pullover shirt/dresss;Pull shirt over trunk Upper body dressing/undressing: 5: Set-up assist to: Obtain clothing/put away FIM - Lower Body Dressing/Undressing Lower body dressing/undressing steps patient completed: Thread/unthread right underwear leg;Pull underwear up/down;Thread/unthread right pants leg;Pull pants up/down;Don/Doff right sock Lower body dressing/undressing: 3: Mod-Patient completed 50-74% of tasks FIM - Toileting Toileting steps completed by patient: Adjust clothing  prior to toileting;Performs perineal hygiene (2/2 steps (naked after toileting) Toileting: 4: Steadying assist FIM - Bed/Chair Transfer Bed/Chair Transfer: 5: Supine > Sit: Supervision (verbal cues/safety issues);4: Bed > Chair or W/C: Min A (steadying Pt. > 75%) FIM - Radio producer Devices: Grab bars;Walker Toilet Transfers: 4-To toilet/BSC: Min A (steadying Pt. > 75%);4-From toilet/BSC: Min A (steadying Pt. > 75%) FIM - Systems developer Devices: Shower chair;Grab bars;Walker Tub/shower Transfers: 4-Into Tub/Shower: Min A (steadying Pt. > 75%/lift 1 leg);4-Out of Tub/Shower: Min A (steadying Pt. > 75%/lift 1 leg)   Refer to Care Plan for Long Term Goals  Recommendations for other services: None  Discharge Criteria: Patient will be discharged from OT if patient refuses treatment 3 consecutive times without medical reason, if treatment goals not met, if there is a change in medical status, if patient makes no progress towards goals or if patient is discharged from hospital.  The above assessment, treatment plan, treatment alternatives and goals were discussed and mutually agreed upon: by patient and by family  Nicoletta Ba 07/09/2014, 11:21 AM

## 2014-07-09 NOTE — Progress Notes (Signed)
Social Work Assessment and Plan Social Work Assessment and Plan  Patient Details  Name: [name] MRN: [id number] Date of Birth: 08-Mar-1946  Today's Date: 07/09/2014  Problem List:  Patient Active Problem List   Diagnosis Date Noted  . Postoperative anemia due to acute blood loss 07/08/2014  . Essential hypertension 07/08/2014  . Fall 07/08/2014  . Expected blood loss anemia 07/03/2014  . Fracture of shaft of left femur 07/02/2014   Past Medical History:  Past Medical History  Diagnosis Date  . Hypertension   . Hyperlipidemia    Past Surgical History:  Past Surgical History  Procedure Laterality Date  . Orif femur fracture Left 07/02/2014    IM NAIL  . Femur im nail Left 07/02/2014    Procedure: ORIF FEMORAL NAIL;  Surgeon: Karla PalMatthew D Olin, MD;  [facility];  Service: Orthopedics;  Laterality: Left;   Social History:  reports that she has never smoked. She has never used smokeless tobacco. She reports that she does not drink alcohol or use illicit drugs.  Family / Support Systems Marital Status: Married Patient Roles: Spouse, Parent Children: [name]  [phone]-cell Other Supports: Karla DillingKenny-grandson  [phone]-cell Anticipated Caregiver: Family members-grandson lives with her and staying here with her Ability/Limitations of Caregiver: No limitations-will have 24 hr Caregiver Availability: 24/7 Family Dynamics: Clsoe knit fmaily pt has eight children but mist are still in [place].  She lives with son and his family and helps with the cooking.  She wants to get back to this, she enjoys this.  [name] she has family here when she fell.  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  She does have a family member here at all times.   Abuse/Neglect Physical Abuse: Denies Verbal Abuse: Denies Sexual Abuse: Denies Exploitation of patient/patient's resources: Denies Self-Neglect: Denies  Emotional Status Pt's affect, behavior adn adjustment status: Pt is motivated to get back home and return to her cooking that she loves.  She has always been independent and able to take care of herself and others.  She raised eight children.  Very hard worker and willing to do whatever she needs to do to recover. Recent Psychosocial Issues: Healthy-recently retired from work Pyschiatric History: No history deferred depression screen due top doing well and feels not necessary.  Will monitor and intervene if needed. Substance Abuse History: No issues  Patient / Family Perceptions, Expectations & Goals Pt/Family understanding of illness & functional limitations: Pt and grandson have a good understanding of her fracture and WB issues.  She is doing well and wants to go home as soon as possible.  Grandson staying with her is attending therapies and learning what he needs to do for her. Premorbid pt/family roles/activities: Mother, grandmother, retiree, church member, etc Anticipated changes in roles/activities/participation: resume Pt/family expectations/goals: Pt states: " I want to go home soon."  Grandson states: " We will be glad to go back home."  [employer] Community Agencies: None Premorbid Home Care/DME Agencies: None Transportation available at discharge: Family members  Discharge Planning Living Arrangements: Children, Other relatives Support Systems: Children, Friends/neighbors, ArchitectChurch/faith community, Other relatives, Spouse/significant other Type of Residence: Private residence Insurance Resources: Electrical engineerMedicare Financial  Resources:  Social Theatre managerecurity Financial Screen Referred: No Living Expenses: Lives with family Money Management: Family Does the patient have any problems obtaining your medications?: No (Recenlty signed up for Medicare part B & D) Home Management: Family until she is able Patient/Family Preliminary Plans: Return home to her family in [name], someone will be there with her at all times, between family members.  Will try to check to see when Medicare parts B & D become effective.  Grandson staying with and learning her care. 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  Aware short length of stay and very happy with this.  Will assist with discharge planning. Needs an interpreter for therapy sessions. Interview was completed with interpreter present.  [name], [name] 07/09/2014, 1:42 PM

## 2014-07-09 NOTE — Progress Notes (Signed)
Subjective/Complaints: [name] with history of HTN, hyperlipidemia; who was getting off the ladder when she fell backward with immediate onset of left leg pain. She was evaluated in ED 07/02/14 and was found to have left communited displaced fracture of the midshaft of the femur. She was evaluated by Dr. Alvan Dame and underwent ORIF left mid shaft femur fracture. Post op PWB LLE   Objective: No c/os  Icing the left hip Taking pain meds Grandson at bedside interpreting Review of Systems - Negative except above Vital Signs: Blood pressure 110/44, pulse 87, temperature 98.9 F (37.2 C), temperature source Oral, resp. rate 18, weight 43.9 kg (96 lb 12.5 oz), SpO2 99 %. No results found. Results for orders placed or performed during the hospital encounter of 07/08/14 (from the past 72 hour(s))  Urinalysis, Routine w reflex microscopic     Status: Abnormal   Collection Time: 07/08/14  7:28 PM  Result Value Ref Range   Color, Urine YELLOW YELLOW   APPearance CLEAR CLEAR   Specific Gravity, Urine <1.005 (L) 1.005 - 1.030   pH 6.5 5.0 - 8.0   Glucose, UA NEGATIVE NEGATIVE mg/dL   Hgb urine dipstick NEGATIVE NEGATIVE   Bilirubin Urine NEGATIVE NEGATIVE   Ketones, ur NEGATIVE NEGATIVE mg/dL   Protein, ur NEGATIVE NEGATIVE mg/dL   Urobilinogen, UA 0.2 0.0 - 1.0 mg/dL   Nitrite NEGATIVE NEGATIVE   Leukocytes, UA TRACE (A) NEGATIVE  Urine microscopic-add on     Status: None   Collection Time: 07/08/14  7:28 PM  Result Value Ref Range   Squamous Epithelial / LPF RARE RARE   WBC, UA 3-6 <3 WBC/hpf   Bacteria, UA RARE RARE   Urine-Other AMORPHOUS URATES/PHOSPHATES   CBC WITH DIFFERENTIAL     Status: Abnormal   Collection Time: 07/09/14  5:41 AM  Result Value Ref Range   WBC 5.8 4.0 - 10.5 K/uL   RBC 3.55 (L) 3.87 - 5.11 MIL/uL   Hemoglobin 10.2 (L) 12.0 - 15.0 g/dL   HCT 31.7 (L) 36.0 - 46.0 %   MCV 89.3 78.0 - 100.0 fL   MCH 28.7 26.0 - 34.0 pg   MCHC 32.2 30.0 - 36.0 g/dL    RDW 15.1 11.5 - 15.5 %   Platelets 285 150 - 400 K/uL   Neutrophils Relative % 59 43 - 77 %   Neutro Abs 3.4 1.7 - 7.7 K/uL   Lymphocytes Relative 28 12 - 46 %   Lymphs Abs 1.6 0.7 - 4.0 K/uL   Monocytes Relative 10 3 - 12 %   Monocytes Absolute 0.6 0.1 - 1.0 K/uL   Eosinophils Relative 3 0 - 5 %   Eosinophils Absolute 0.2 0.0 - 0.7 K/uL   Basophils Relative 0 0 - 1 %   Basophils Absolute 0.0 0.0 - 0.1 K/uL  Comprehensive metabolic panel     Status: Abnormal   Collection Time: 07/09/14  5:41 AM  Result Value Ref Range   Sodium 142 137 - 147 mEq/L   Potassium 4.4 3.7 - 5.3 mEq/L   Chloride 103 96 - 112 mEq/L   CO2 28 19 - 32 mEq/L   Glucose, Bld 94 70 - 99 mg/dL   BUN 18 6 - 23 mg/dL   Creatinine, Ser 0.51 0.50 - 1.10 mg/dL   Calcium 8.7 8.4 - 10.5 mg/dL   Total Protein 6.4 6.0 - 8.3 g/dL   Albumin 2.7 (L) 3.5 - 5.2 g/dL   AST 92 (H) 0 -  37 U/L   ALT 125 (H) 0 - 35 U/L   Alkaline Phosphatase 122 (H) 39 - 117 U/L   Total Bilirubin 0.7 0.3 - 1.2 mg/dL   GFR calc non Af Amer >90 >90 mL/min   GFR calc Af Amer >90 >90 mL/min    Comment: (NOTE) The eGFR has been calculated using the CKD EPI equation. This calculation has not been validated in all clinical situations. eGFR's persistently <90 mL/min signify possible Chronic Kidney Disease.    Anion gap 11 5 - 15     HEENT: normal Cardio: RRR Resp: CTA B/L and unlabored GI: BS positive and NT, ND Extremity:  Pulses positive and No Edema Skin:   Intact and Wound C/D/I Neuro: Abnormal Motor 5/5 in BUE and RLE, 2- Left HF, KE pain inhibition Musc/Skel:  Swelling Left thigh Gen NAD   Assessment/Plan: 1. Functional deficits secondary to  left mid shaft femur fracture which require 3+ hours per day of interdisciplinary therapy in a comprehensive inpatient rehab setting. Physiatrist is providing close team supervision and 24 hour management of active medical problems listed below. Physiatrist and rehab team continue to assess  barriers to discharge/monitor patient progress toward functional and medical goals. FIM:                                  Medical Problem List and Plan: 1. Functional deficits secondary to left mid shaft femur fracture 2. DVT Prophylaxis/Anticoagulation: Pharmaceutical: Lovenox 3. Pain Management: Patient and grandson educated on pain management--patient afraid to take narcotics. Will continue hydrocodone prn but premedicate prior to therapy sessions. Ice left hip tid and prn 4. Mood: LCSW to follow with for evaluation and support.  5. Neuropsych: This patient is capable of making decisions on her own behalf. 6. Skin/Wound Care: Rehab RN to monitor skin and wound daily. Routine pressure relief measures.  7. Fluids/Electrolytes/Nutrition: Monitor I/O. Offer supplements if intake poor. Add protein supplement to help promote healing.  8. ABLA: Continue iron supplement. Recheck labs in am.  9. Hypokalemia: Likely dilutional. Discontinue IVF. Add oral supplement and recheck labs in am.  10 HTN: Will monitor every 8 hours. Continue Norvasc daily.  11. Constipation: NO BM since admission. Will adjust bowel program.   LOS (Days) 1 A FACE TO FACE EVALUATION WAS PERFORMED  Terryl Niziolek E 07/09/2014, 8:36 AM

## 2014-07-09 NOTE — Plan of Care (Signed)
Problem: RH PAIN MANAGEMENT Goal: RH STG PAIN MANAGED AT OR BELOW PT'S PAIN GOAL Pain less than 3  Outcome: Progressing     

## 2014-07-09 NOTE — Progress Notes (Signed)
PMR Admission Coordinator Pre-Admission Assessment  Patient: [name] is an 68 y.o., female MRN: [id number] DOB: 09-23-1945 Height: 5\' 2"  (157.5 cm) Weight: 40.824 kg (90 lb)  Insurance Information  PRIMARY: Medicare A only Policy#: [id number] Subscriber: self Pre-Cert#: verified in [employer]: retired [employer]. Date: A: 02-20-11 Deduct: $1260 Out of Pocket Max: none Life Max: unlimited CIR: 100% SNF: 100% days 1-20; 80% days 21-100 (100 day visit max) Outpatient: no coverage Co-Pay: NA Home Health: 100% Co-Pay: none, no visit limits DME: no coverage Co-Pay: NA Providers: pt's preference.  Emergency Contact Information Contact Information    Name Relation Home Work Superior Daughter [phone]     Abid,Vu Son   [phone]   Jessel, Gettinger   [phone]     Current Medical History  Patient Admitting Diagnosis: left mid shaft femur fracture  History of Present Illness: [name] is a 68 y.o. [name] female with history of HTN, hyperlipidemia; who was getting off the ladder when she fell backward with immediate onset of left leg pain. She was evaluated in ED 07/02/14 and was found to have left communited displaced fracture of the midshaft of the femur. She was evaluated by Dr. Charlann Boxer and underwent ORIF left mid shaft femur fracture. Post op PWB LLE and on lovenox for DVT prophylaxis. PT evaluation done yesterday and patient noted to have difficulty maintaining WB precautions as well as fear limiting mobility. MD and PT recommending CIR for follow up therapy.  Past Medical History  Past Medical History  Diagnosis Date  . Hypertension   . Hyperlipidemia     Family History  family history includes Hypertension in her father.  Prior  Rehab/Hospitalizations: none  Current Medications  Current facility-administered medications: 0.9 % sodium chloride infusion, , Intravenous, Once, Genelle Gather Babish, PA-C; alum & mag hydroxide-simeth (MAALOX/MYLANTA) 200-200-20 MG/5ML suspension 30 mL, 30 mL, Oral, Q4H PRN, Genelle Gather Babish, PA-C; amLODipine (NORVASC) tablet 5 mg, 5 mg, Oral, Daily, Genelle Gather Babish, PA-C, 5 mg at 07/07/14 7253 bisacodyl (DULCOLAX) suppository 10 mg, 10 mg, Rectal, Daily PRN, Genelle Gather Babish, PA-C; diphenhydrAMINE (BENADRYL) capsule 25 mg, 25 mg, Oral, Q6H PRN, Genelle Gather Babish, PA-C; docusate sodium (COLACE) capsule 100 mg, 100 mg, Oral, BID, Genelle Gather Babish, PA-C, 100 mg at 07/07/14 2136; enoxaparin (LOVENOX) injection 20 mg, 20 mg, Subcutaneous, Q24H, Shelda Pal, MD, 20 mg at 07/07/14 1224 ferrous sulfate tablet 325 mg, 325 mg, Oral, TID PC, Genelle Gather Babish, PA-C, 325 mg at 07/07/14 1700; HYDROcodone-acetaminophen (NORCO/VICODIN) 5-325 MG per tablet 1-2 tablet, 1-2 tablet, Oral, Q6H PRN, Genelle Gather Babish, PA-C, 1 tablet at 07/07/14 2136; HYDROmorphone (DILAUDID) injection 0.5-2 mg, 0.5-2 mg, Intravenous, Q2H PRN, Genelle Gather Babish, PA-C menthol-cetylpyridinium (CEPACOL) lozenge 3 mg, 1 lozenge, Oral, PRN **OR** phenol (CHLORASEPTIC) mouth spray 1 spray, 1 spray, Mouth/Throat, PRN, Genelle Gather Babish, PA-C; methocarbamol (ROBAXIN) tablet 500 mg, 500 mg, Oral, Q6H PRN **OR** methocarbamol (ROBAXIN) 500 mg in dextrose 5 % 50 mL IVPB, 500 mg, Intravenous, Q6H PRN, Genelle Gather Babish, PA-C, 500 mg at 07/02/14 2009 metoCLOPramide (REGLAN) tablet 5-10 mg, 5-10 mg, Oral, Q8H PRN **OR** metoCLOPramide (REGLAN) injection 5-10 mg, 5-10 mg, Intravenous, Q8H PRN, Genelle Gather Babish, PA-C; ondansetron (ZOFRAN) tablet 4 mg, 4 mg, Oral, Q6H PRN **OR** ondansetron (ZOFRAN) injection 4 mg, 4 mg, Intravenous, Q6H PRN, Genelle Gather Babish, PA-C; polyethylene glycol  (MIRALAX / GLYCOLAX) packet 17 g, 17 g, Oral, Daily PRN, Genelle Gather Babish, PA-C pravastatin (PRAVACHOL) tablet 20 mg, 20  mg, Oral, Daily, Genelle GatherMatthew Scott Babish, PA-C, 20 mg at 07/07/14 [id number]; sodium chloride 0.9 % 1,000 mL with potassium chloride 10 mEq infusion, , Intravenous, Continuous, Genelle GatherMatthew Scott Babish, PA-C, Last Rate: 75 mL/hr at 07/04/14 0241  Patients Current Diet: Diet regular (note: pt does not eat beef for personal reasons)  Precautions / Restrictions Precautions Precautions: Fall Precaution Comments: Education officer, communityacific Interpreter service used; interpreter number [phone] Restrictions Weight Bearing Restrictions: Yes LLE Weight Bearing: Partial weight bearing LLE Partial Weight Bearing Percentage or Pounds: 50% PWB   Prior Activity Level Community (5-7x/wk): Pt was independent prior to this fall and 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 Pt [place] her family live in [place]. Pt enjoys gardening and sustained this injury after falling off a ladder while trying to pick fruit from the tree. Pt does not drive but gets out every other day with family on errands and enjoys going to the [employer].  Home Assistive Devices / Equipment Home Assistive Devices/Equipment: Dentures (specify type) Home Equipment: None  Prior Functional Level Prior Function Level of Independence: Independent Comments: daughter in law reports pt. 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  Current Functional Level Cognition  Overall Cognitive Status: Within Systems developerunctional Limits for tasks assessed Orientation Level: Oriented X4   Extremity Assessment (includes Sensation/Coordination)  Upper Extremity Assessment: Defer to OT evaluation Lower Extremity Assessment: LLE deficits/detail LLE Deficits / Details: able to ankle pump and quad set; limited due to post op status and pain Cervical / Trunk Assessment: Normal   ADLs  Anticipate ADL needs    Mobility  Overal  bed mobility: Needs Assistance, +2 for physical assistance Bed Mobility: Supine to Sit Supine to sit: +2 for physical assistance, Mod assist General bed mobility comments: pt sitting in recliner.     Transfers  Overall transfer level: Needs assistance Equipment used: Rolling walker (2 wheeled) Transfers: Sit to/from Stand Sit to Stand: Min guard Stand pivot transfers: Min assist General transfer comment: cues for UE use especially for control with stand to sit.     Ambulation / Gait / Stairs / Wheelchair Mobility  Ambulation/Gait Ambulation/Gait assistance: Min guard (with physical contact) Ambulation Distance (Feet): 40 Feet Assistive device: Rolling walker (2 wheeled) Gait Pattern/deviations: Step-to pattern, Decreased stance time - left, Decreased step length - right Gait velocity: decreased Gait velocity interpretation: Below normal speed for age/gender General Gait Details: cues for gait sequence and to unweigh LLE in stance to 50%PWB by taking half of her weight onto RW when she steps her RLE    Posture / Balance Overall balance assessment: Needs assistance Sitting-balance support: No upper extremity supported;Feet supported Sitting balance-Leahy Scale: Fair Standing balance support: Bilateral upper extremity supported;During functional activity Standing balance-Leahy Scale: Poor Standing balance comment: needs external support for upright    Special needs/care consideration BiPAP/CPAP no  CPM no  Continuous Drip IV no  Dialysis no  Life Vest no Oxygen no  Special Bed no  Trach Size no  Wound Vac (area) no  Skin - current L hip surgical incision  Bowel mgmt: Last BM 07/02/14 Bladder mgmt: using BSC or bedpan with assist Diabetic mgmt no    Previous Home Environment Living Arrangements: Children, Spouse/significant other Available Help at Discharge: Family, Available 24 hours/day (grandson) Type of  Home: House Home Layout: Two level Alternate Level Stairs-Rails: Right Alternate Level Stairs-Number of Steps: 10-12 Home Access: Stairs to enter Entrance Stairs-Rails: Right Entrance Stairs-Number of Steps: 2 Home Care Services: Other (Comment) (unsure  at this time ) Additional Comments: daughter in law states pt. can stay downstairs if needed  Discharge Living Setting Plans for Discharge Living Setting: Patient's home Type of Home at Discharge: House Discharge Home Layout: Two level (can stay on main floor if needed) Alternate Level Stairs-Rails: Right Alternate Level Stairs-Number of Steps: flight Discharge Home Access: Stairs to enter Entrance Stairs-Rails: Right Entrance Stairs-Number of Steps: 2 Does the patient have any problems obtaining your medications?: No  Social/Family/Support Systems Patient Roles: Other (Comment) (grandmother, mother and recently retired from [employer] architectwarehouse) SolicitorContact Information: Jerene DillingGrandson Kenny and son Vu are primary contacts Anticipated Caregiver: Lucila MaineGrandson, son and daughter in law Anticipated Caregiver's Contact Information: see above Ability/Limitations of Caregiver: no limitations Caregiver Availability: 24/7 Discharge Plan Discussed with Primary Caregiver: Yes Is Caregiver In Agreement with Plan?: Yes Does Caregiver/Family have Issues with Lodging/Transportation while Pt is in Rehab?: No  Goals/Additional Needs Patient/Family Goal for Rehab: Mod ind with PT/OT; NA for SLP Expected length of stay: 7 days Cultural Considerations: pt is [place] and speaks little to no [place] Dietary Needs: regular diet (note: pt does NOT eat beef for personal reasons) Equipment Needs: to be determined Special Service Needs: will likely need [place] interpreter as pt speaks little to no [place]  Pt/Family Agrees to Admission and willing to participate: Yes Program Orientation Provided & Reviewed with Pt/Caregiver Including Roles & Responsibilities:  Yes  Decrease burden of Care through IP rehab admission: NA  Possible need for SNF placement upon discharge: not anticipated  Patient Condition: This patient's medical and functional status has changed since the consult dated: 07/04/14 in which the Rehabilitation Physician determined and documented that the patient's condition is appropriate for intensive rehabilitative care in an inpatient rehabilitation facility. See "History of Present Illness" (above) for medical update. Functional changes are: Currently requiring min guard assist to ambulate 40 ft RW. Patient's medical and functional status update has been discussed with the Rehabilitation physician and patient remains appropriate for inpatient rehabilitation. Will admit to inpatient rehab today.  Preadmission Screen Completed By: Lelon FrohlichLogue, Giordano Getman, 07/08/2014 2:21 PM ______________________________________________________________________  Discussed status with Dr. Riley KillSwartz on 07/08/14 at 1001 and received telephone approval for admission today.  Admission Coordinator: Lelon FrohlichLogue, Francine Hannan, time1001/Date11/17/15          Cosigned by: Ranelle OysterZachary T Swartz, MD at 07/08/2014 10:09 AM  Revision History

## 2014-07-09 NOTE — Patient Care Conference (Signed)
Inpatient RehabilitationTeam Conference and Plan of Care Update Date: 07/09/2014   Time: 11;57 AM    Patient Name: [name]      Medical Record Number: [id number]  Date of Birth: 06/03/1946 Sex: Female         Room/Bed: 4W21C/4W21C-01 Payor Info: Payor: MEDICARE / Plan: MEDICARE PART A / Product Type: *No Product type* /    Admitting Diagnosis: L femur fx    Admit Date/Time:  07/08/2014  5:14 PM Admission Comments: No comment available   Primary Diagnosis:  Fracture of shaft of left femur Principal Problem: Fracture of shaft of left femur  Patient Active Problem List   Diagnosis Date Noted  . Postoperative anemia due to acute blood loss 07/08/2014  . Essential hypertension 07/08/2014  . Fall 07/08/2014  . Expected blood loss anemia 07/03/2014  . Fracture of shaft of left femur 07/02/2014    Expected Discharge Date: Expected Discharge Date: 07/14/14  Team Members Present: Physician leading conference: Dr. Claudette LawsAndrew Kirsteins Social Worker Present: Dossie DerBecky Husain Costabile, LCSW Nurse Present: Carmie EndAngie Joyce, RN PT Present: Wanda Plumparoline Cook, PT;Blair [name], PT OT Present: Rosalio LoudSarah Hoxie, OT SLP Present: Fae PippinMelissa Bowie, SLP PPS Coordinator present : Tora DuckMarie Noel, RN, CRRN     Current Status/Progress Goal Weekly Team Focus  Medical   low BP this am,   Maintain, BPs in range  check orthostatics   Bowel/Bladder   Continent of bowel and bladder  Mod I  Cont. plan of care   Swallow/Nutrition/ Hydration     na        ADL's     eval pending        Mobility   supervision-mod I  mod I except min A stairs  functional mobility, adherance to WB status, LE strengthening/ROM, balance, pt/family education   Communication     na        Safety/Cognition/ Behavioral Observations    no unsafe behaviors        Pain   Scheduled 1 norco BID with PRN tramadol q6hrs  3 or less  medicate scheduled medications and PRN/ prior to therapy   Skin   3 Incisions to left hip with foam CDI  remain free of skin  breakdown and free from infection  assess skin q shift; change dressing as neede      *See Care Plan and progress notes for long and short-term goals.  Barriers to Discharge: language barrier    Possible Resolutions to Barriers:  pro or family interpreter    Discharge Planning/Teaching Needs:    Home with family whom she lives with in [place]-family can provide 24 hr if needed.     Team Discussion:  New eval doing well with PWB-grandson staying here with her.  Does need an interpreter for communication barrier. Goals set at mod/i level  Revisions to Treatment Plan:  New eval   Continued Need for Acute Rehabilitation Level of Care: The patient requires daily medical management by a physician with specialized training in physical medicine and rehabilitation for the following conditions: Daily direction of a multidisciplinary physical rehabilitation program to ensure safe treatment while eliciting the highest outcome that is of practical value to the patient.: Yes Daily medical management of patient stability for increased activity during participation in an intensive rehabilitation regime.: Yes Daily analysis of laboratory values and/or radiology reports with any subsequent need for medication adjustment of medical intervention for : Post surgical problems;Other  Naythan Douthit, Lemar LivingsRebecca G 07/11/2014, 1:46 PM

## 2014-07-10 ENCOUNTER — Inpatient Hospital Stay (HOSPITAL_COMMUNITY): Payer: Medicare Other | Admitting: Physical Therapy

## 2014-07-10 ENCOUNTER — Inpatient Hospital Stay (HOSPITAL_COMMUNITY): Payer: Medicare Other | Admitting: Occupational Therapy

## 2014-07-10 DIAGNOSIS — W19XXXD Unspecified fall, subsequent encounter: Secondary | ICD-10-CM

## 2014-07-10 NOTE — Progress Notes (Signed)
Occupational Therapy Session Note  Patient Details  Name: Orlie Dakinau Berges MRN: [id number] Date of Birth: [birth year]  Today's Date: 07/10/2014 OT Individual Time: [id number] OT Individual Time Calculation (min): 60 min    Short Term Goals: Week 1:  OT Short Term Goal 1 (Week 1): STG=LTG  Skilled Therapeutic Interventions/Progress Updates:    Engaged in ADL retraining with focus on functional mobility and transfers with RW, sit <> stand, standing balance, and LB dressing tasks.  Min verbal cues for WB precautions with mobility and transfers, pt's grandson present and providing interpretation (gestures used during bathing).  Ambulated bed > toilet > room shower with RW and min/steady assist.  Pt completed bathing with steady assist, requiring verbal cues for safety and management of LLE during bathing.  Educated pt on use of long handled sponge and provided sponge at end of session.  Pt able to don underwear and skirt this session without AE, however educated on use of reacher to assist with LB dressing and plan to follow up during next bathing session.  Pt with questions regarding bruising on LLE.  Therapy Documentation Precautions:  Precautions Precautions: Fall Restrictions Weight Bearing Restrictions: Yes LLE Weight Bearing: Partial weight bearing LLE Partial Weight Bearing Percentage or Pounds: 50% PWB General:   Vital Signs: Therapy Vitals BP: (!) 120/51 mmHg Pain: Pain Assessment Pain Assessment: 0-10 Pain Score: 5  Pain Type: Surgical pain Pain Location: Leg Pain Orientation: Left Pain Descriptors / Indicators: Aching Pain Onset: On-going Pain Intervention(s): Medication (See eMAR)  See FIM for current functional status  Therapy/Group: Individual Therapy  Rosalio LoudHOXIE, Donella Pascarella 07/10/2014, 10:47 AM

## 2014-07-10 NOTE — Progress Notes (Signed)
Subjective/Complaints: [name] with history of HTN, hyperlipidemia; who was getting off the ladder when she fell backward with immediate onset of left leg pain. She was evaluated in ED 07/02/14 and was found to have left communited displaced fracture of the midshaft of the femur. She was evaluated by Dr. Alvan Dame and underwent ORIF left mid shaft femur fracture. Post op PWB LLE   Objective: No new issues  Review of Systems - Negative except above Vital Signs: Blood pressure 128/54, pulse 91, temperature 98.2 F (36.8 C), temperature source Oral, resp. rate 18, weight 43.046 kg (94 lb 14.4 oz), SpO2 100 %. No results found. Results for orders placed or performed during the hospital encounter of 07/08/14 (from the past 72 hour(s))  Culture, Urine     Status: None   Collection Time: 07/08/14  7:28 PM  Result Value Ref Range   Specimen Description URINE, CLEAN CATCH    Special Requests NONE    Culture  Setup Time      07/08/2014 19:58 Performed at [employer] Performed at [facility]     Culture NO GROWTH Performed at [facility]     Report Status 07/09/2014 FINAL   Urinalysis, Routine w reflex microscopic     Status: Abnormal   Collection Time: 07/08/14  7:28 PM  Result Value Ref Range   Color, Urine YELLOW YELLOW   APPearance CLEAR CLEAR   Specific Gravity, Urine <1.005 (L) 1.005 - 1.030   pH 6.5 5.0 - 8.0   Glucose, UA NEGATIVE NEGATIVE mg/dL   Hgb urine dipstick NEGATIVE NEGATIVE   Bilirubin Urine NEGATIVE NEGATIVE   Ketones, ur NEGATIVE NEGATIVE mg/dL   Protein, ur NEGATIVE NEGATIVE mg/dL   Urobilinogen, UA 0.2 0.0 - 1.0 mg/dL   Nitrite NEGATIVE NEGATIVE   Leukocytes, UA TRACE (A) NEGATIVE  Urine microscopic-add on     Status: None   Collection Time: 07/08/14  7:28 PM  Result Value Ref Range   Squamous Epithelial / LPF RARE RARE   WBC, UA 3-6 <3 WBC/hpf   Bacteria, UA RARE RARE   Urine-Other AMORPHOUS  URATES/PHOSPHATES   CBC WITH DIFFERENTIAL     Status: Abnormal   Collection Time: 07/09/14  5:41 AM  Result Value Ref Range   WBC 5.8 4.0 - 10.5 K/uL   RBC 3.55 (L) 3.87 - 5.11 MIL/uL   Hemoglobin 10.2 (L) 12.0 - 15.0 g/dL   HCT 31.7 (L) 36.0 - 46.0 %   MCV 89.3 78.0 - 100.0 fL   MCH 28.7 26.0 - 34.0 pg   MCHC 32.2 30.0 - 36.0 g/dL   RDW 15.1 11.5 - 15.5 %   Platelets 285 150 - 400 K/uL   Neutrophils Relative % 59 43 - 77 %   Neutro Abs 3.4 1.7 - 7.7 K/uL   Lymphocytes Relative 28 12 - 46 %   Lymphs Abs 1.6 0.7 - 4.0 K/uL   Monocytes Relative 10 3 - 12 %   Monocytes Absolute 0.6 0.1 - 1.0 K/uL   Eosinophils Relative 3 0 - 5 %   Eosinophils Absolute 0.2 0.0 - 0.7 K/uL   Basophils Relative 0 0 - 1 %   Basophils Absolute 0.0 0.0 - 0.1 K/uL  Comprehensive metabolic panel     Status: Abnormal   Collection Time: 07/09/14  5:41 AM  Result Value Ref Range   Sodium 142 137 - 147 mEq/L   Potassium 4.4 3.7 -  5.3 mEq/L   Chloride 103 96 - 112 mEq/L   CO2 28 19 - 32 mEq/L   Glucose, Bld 94 70 - 99 mg/dL   BUN 18 6 - 23 mg/dL   Creatinine, Ser 0.51 0.50 - 1.10 mg/dL   Calcium 8.7 8.4 - 10.5 mg/dL   Total Protein 6.4 6.0 - 8.3 g/dL   Albumin 2.7 (L) 3.5 - 5.2 g/dL   AST 92 (H) 0 - 37 U/L   ALT 125 (H) 0 - 35 U/L   Alkaline Phosphatase 122 (H) 39 - 117 U/L   Total Bilirubin 0.7 0.3 - 1.2 mg/dL   GFR calc non Af Amer >90 >90 mL/min   GFR calc Af Amer >90 >90 mL/min    Comment: (NOTE) The eGFR has been calculated using the CKD EPI equation. This calculation has not been validated in all clinical situations. eGFR's persistently <90 mL/min signify possible Chronic Kidney Disease.    Anion gap 11 5 - 15     HEENT: normal Cardio: RRR Resp: CTA B/L and unlabored GI: BS positive and NT, ND Extremity:  Pulses positive and No Edema Skin:   Intact and Wound C/D/I Neuro: Abnormal Motor 5/5 in BUE and RLE, 2- Left HF, KE pain inhibition Musc/Skel:  Swelling Left thigh Gen  NAD   Assessment/Plan: 1. Functional deficits secondary to  left mid shaft femur fracture, PWB which require 3+ hours per day of interdisciplinary therapy in a comprehensive inpatient rehab setting. Physiatrist is providing close team supervision and 24 hour management of active medical problems listed below. Physiatrist and rehab team continue to assess barriers to discharge/monitor patient progress toward functional and medical goals. FIM: FIM - Bathing Bathing Steps Patient Completed: Chest, Right Arm, Left Arm, Abdomen, Front perineal area, Buttocks, Right upper leg, Left upper leg, Right lower leg (including foot) Bathing: 4: Min-Patient completes 8-9 54f10 parts or 75+ percent  FIM - Upper Body Dressing/Undressing Upper body dressing/undressing steps patient completed: Put head through opening of pull over shirt/dress, Thread/unthread left sleeve of pullover shirt/dress, Thread/unthread right sleeve of pullover shirt/dresss, Pull shirt over trunk Upper body dressing/undressing: 5: Set-up assist to: Obtain clothing/put away FIM - Lower Body Dressing/Undressing Lower body dressing/undressing steps patient completed: Thread/unthread right underwear leg, Pull underwear up/down, Thread/unthread right pants leg, Pull pants up/down, Don/Doff right sock Lower body dressing/undressing: 3: Mod-Patient completed 50-74% of tasks  FIM - Toileting Toileting steps completed by patient: Adjust clothing prior to toileting, Performs perineal hygiene (2/2 steps (naked after toileting) Toileting: 4: Steadying assist  FIM - TRadio producerDevices: Grab bars, WInsurance account managerTransfers: 4-To toilet/BSC: Min A (steadying Pt. > 75%), 4-From toilet/BSC: Min A (steadying Pt. > 75%)  FIM - Bed/Chair Transfer Bed/Chair Transfer Assistive Devices: WCopy 4: Bed > Chair or W/C: Min A (steadying Pt. > 75%), 4: Chair or W/C > Bed: Min A (steadying Pt. > 75%), 5: Sit >  Supine: Supervision (verbal cues/safety issues), 5: Supine > Sit: Supervision (verbal cues/safety issues)  FIM - Locomotion: Wheelchair Distance: 150 Locomotion: Wheelchair: 4: Travels 150 ft or more: maneuvers on rugs and over door sillls with minimal assistance (Pt.>75%) FIM - Locomotion: Ambulation Locomotion: Ambulation Assistive Devices: WAdministratorAmbulation/Gait Assistance: 4: Min guard Locomotion: Ambulation: 2: Travels 50 - 149 ft with minimal assistance (Pt.>75%)  Comprehension Comprehension Mode: Auditory Comprehension: 6-Follows complex conversation/direction: With extra time/assistive device  Expression Expression Mode: Verbal Expression: 7-Expresses complex ideas: With no assist  Social  Interaction Social Interaction: 7-Interacts appropriately with others - No medications needed.  Problem Solving Problem Solving: 6-Solves complex problems: With extra time  Memory Memory: 7-Complete Independence: No helper  Medical Problem List and Plan: 1. Functional deficits secondary to left mid shaft femur fracture 2. DVT Prophylaxis/Anticoagulation: Pharmaceutical: Lovenox 3. Pain Management: 5/10 Will continue hydrocodone prn but premedicate prior to therapy sessions. Ice left hip tid and prn 4. Mood: LCSW to follow with for evaluation and support.  5. Neuropsych: This patient is capable of making decisions on her own behalf. 6. Skin/Wound Care: Rehab RN to monitor skin and wound daily. Routine pressure relief measures.  7. Fluids/Electrolytes/Nutrition: Monitor I/O. Offer supplements if intake poor. Add protein supplement to help promote healing.  8. ABLA: Continue iron supplement. Recheck labs in am.  9. Hypokalemia: Likely dilutional. Discontinue IVF. Add oral supplement and recheck labs in am.  10 HTN: Will monitor every 8 hours. Continue Norvasc daily.  11. Constipation:  Will adjust bowel program.   LOS (Days) 2 A FACE TO FACE EVALUATION WAS  PERFORMED  [name] 07/10/2014, 7:33 AM

## 2014-07-10 NOTE — IPOC Note (Signed)
Overall Plan of Care [facility](IPOC) Patient Details Name: [name] MRN: [id number] DOB: 1946-06-10  Admitting Diagnosis: L femur fx    Hospital Problems: Principal Problem:   Fracture of shaft of left femur Active Problems:   Postoperative anemia due to acute blood loss   Essential hypertension     Functional Problem List: Nursing Edema, Endurance, Medication Management, Pain, Sensory, Skin Integrity, Safety  PT Balance, Edema, Endurance, Motor, Pain  OT Balance, Edema, Endurance, Pain, Motor, Skin Integrity  SLP    TR         Basic ADL's: OT Grooming, Bathing, Dressing, Toileting     Advanced  ADL's: OT Simple Meal Preparation     Transfers: PT Bed Mobility, Bed to Chair, Car, Occupational psychologisturniture  OT Toilet, Research scientist (life sciences)Tub/Shower     Locomotion: PT Ambulation, Stairs, Psychologist, prison and probation servicesWheelchair Mobility     Additional Impairments: OT None  SLP        TR      Anticipated Outcomes Item Anticipated Outcome  Self Feeding n/a  Swallowing      Basic self-care  mod I  Toileting  mod I    Bathroom Transfers mod I   Bowel/Bladder  Manage b/b Mod I  Transfers  Mod I  Locomotion  Mod I  Communication     Cognition     Pain  3 or less  Safety/Judgment  Mod I   Therapy Plan: PT Intensity: Minimum of 1-2 x/day ,45 to 90 minutes PT Frequency: 5 out of 7 days PT Duration Estimated Length of Stay: 5-7 days OT Intensity: Minimum of 1-2 x/day, 45 to 90 minutes OT Frequency: 5 out of 7 days OT Duration/Estimated Length of Stay: 5-7 days         Team Interventions: Nursing Interventions Patient/Family Education, Pain Management, Medication Management, Skin Care/Wound Management, Discharge Planning  PT interventions Ambulation/gait training, Balance/vestibular training, Discharge planning, DME/adaptive equipment instruction, Functional mobility training, Pain management, Patient/family education, Stair training, Therapeutic Activities, Therapeutic Exercise, UE/LE Strength taining/ROM, Wheelchair  propulsion/positioning  OT Interventions Warden/rangerBalance/vestibular training, FirefighterCommunity reintegration, Discharge planning, Fish farm managerDME/adaptive equipment instruction, Patient/family education, Self Care/advanced ADL retraining, Therapeutic Exercise, UE/LE Coordination activities, Wheelchair propulsion/positioning, UE/LE Strength taining/ROM, Therapeutic Activities, Skin care/wound managment, Pain management, Functional mobility training  SLP Interventions    TR Interventions    SW/CM Interventions Discharge Planning, Psychosocial Support, Patient/Family Education    Team Discharge Planning: Destination: PT-Home ,OT- Home , SLP-  Projected Follow-up: PT-Home health PT, OT-  None, SLP-  Projected Equipment Needs: PT-Wheelchair (measurements), Wheelchair cushion (measurements), Rolling walker with 5" wheels, OT- To be determined, SLP-  Equipment Details: PT- , OT-  Patient/family involved in discharge planning: PT- Family member/caregiver, Patient,  OT-Patient, Family member/caregiver, SLP-   MD ELOS: 7-10d Medical Rehab Prognosis:  Good Assessment: 68 y.o. [place] female with history of HTN, hyperlipidemia; who was getting off the ladder when she fell backward with immediate onset of left leg pain. She was evaluated in ED 07/02/14 and was found to have left communited displaced fracture of the midshaft of the femur. She was evaluated by Dr. Charlann Boxerlin and underwent ORIF left mid shaft femur fracture. Post op PWB LLE   Now requiring 24/7 Rehab RN,MD, as well as CIR level PT, OT and SLP.  Treatment team will focus on ADLs and mobility with goals set at Mod I     See Team Conference Notes for weekly updates to the plan of care

## 2014-07-10 NOTE — Progress Notes (Signed)
Physical Therapy Session Note  Patient Details  Name: [name] MRN: [id number] Date of Birth: 1946-06-16  Today's Date: 07/11/2014 PT Individual Time: 1100-1203  PT Individual Time Calculation (min): 63 min   Short Term Goals: Week 1:  PT Short Term Goal 1 (Week 1): = LTG secondary to short LOS  Skilled Therapeutic Interventions/Progress Updates:    Treatment Session 1: Pt received supine in bed accompanied by grandson. Pt performed gait 2 x150' then 1x220' in controlled environment with rolling walker and supervision with min cueing for adherence to LLE PWB with effective within-session carryover. Pt negotiated 3 stairs without rails (to simulate home entrance) forwards with rolling walker and min A, verbal/demonstration cueing for adherence to [facility] LLCWB with effective return demonstration. In ortho gym, this PT explained, demonstrated car transfer with rolling walker; pt with effective return demonstration of simulated car transfer with rolling walker and supervision, cueing for safe hand placement. Transitioned to negotiation of single curb step x3 trials with rolling walker and close supervision, mod verbal/demonstration cueing for sequencing, technique; pt will require reinforcement. Pt required close supervision and manual stabilization of rolling walker during both ascent and descent of standard ramp with rolling walker. Session ended in pt room, where pt was left supine in bed with 3 rails up, bed alarm on, and all needs within reach.   Therapy Documentation Precautions:  Precautions Precautions: Fall Restrictions Weight Bearing Restrictions: Yes LLE Weight Bearing: Partial weight bearing LLE Partial Weight Bearing Percentage or Pounds: 50% PWB Vital Signs: Therapy Vitals BP: (!) 120/51 mmHg Pain: Pain Assessment Pain Assessment: 0-10 Pain Score: 4  Pain Type: Surgical pain Pain Location: Leg Pain Orientation: Left Pain Descriptors / Indicators: Aching Pain Onset: On-going Pain  Intervention(s): Ambulation/increased activity Locomotion : Ambulation Ambulation/Gait Assistance: 5: Supervision   See FIM for current functional status  Therapy/Group: Individual Therapy  [name], Lorenda IshiharaBlair A 07/11/2014, 9:23 AM

## 2014-07-10 NOTE — Plan of Care (Signed)
Problem: RH SKIN INTEGRITY Goal: RH STG SKIN FREE OF INFECTION/BREAKDOWN With min assist  Outcome: Progressing Goal: RH STG ABLE TO PERFORM INCISION/WOUND CARE W/ASSISTANCE STG Able To Perform Incision/Wound Care With BJ'sMin Assistance.  Outcome: Progressing  Problem: RH SAFETY Goal: RH STG ADHERE TO SAFETY PRECAUTIONS W/ASSISTANCE/DEVICE STG Adhere to Safety Precautions With supervision Assistance/Device.  Outcome: Progressing

## 2014-07-10 NOTE — Progress Notes (Signed)
Occupational Therapy Session Note  Patient Details  Name: Orlie Dakinau Karpf MRN: [id number] Date of Birth: 1945/11/10  Today's Date: 07/10/2014 OT Concurrent Time: 1330-1430 OT Concurrent Time Calculation (min): 60 min  Short Term Goals: Week 1:  OT Short Term Goal 1 (Week 1): STG=LTG  Skilled Therapeutic Interventions/Progress Updates:  Patient received supine in bed with grandson present as an interpreter. Focused skilled intervention on bed mobility, functional mobility using RW, adhering to PWB>LLE, overall activity tolerance/endurance, functional transfers, BUE strengthening. Patient performed toilet transfer and toileting at supervision level, then ambulated from room > ADL apartment while adhering to PWB > LLE and no cues needed from therapist. Once in ADL apartment, patient engaged in simulated walk-in shower transfer onto shower seat and tub/shower transfer onto tub transfer bench. Patient with both set-ups at home, but stated she preferred walk-in shower at this time. Recommending shower seat AND grab bars if patient uses walk-in shower. Patient ambulated > therapy gym, sat on mat, and engaged in bed mobility. Patient took ~3 minute break, then sat edge of mat and completed UB HEP using 4lb weighted bar. Educated patient on 5 exercises that focused on elbows, shoulders, and trunk. Patient ambulated back to room and transferred back to bed. All needs left within reach, bed alarm set, and grandson present upon therapist leaving room.   Precautions:  Precautions Precautions: Fall Restrictions Weight Bearing Restrictions: Yes LLE Weight Bearing: Partial weight bearing LLE Partial Weight Bearing Percentage or Pounds: 50% PWB  See FIM for current functional status  Therapy/Group: Individual Therapy  Safira Proffit 07/10/2014, 2:44 PM

## 2014-07-11 ENCOUNTER — Inpatient Hospital Stay (HOSPITAL_COMMUNITY): Payer: Medicare Other

## 2014-07-11 ENCOUNTER — Inpatient Hospital Stay (HOSPITAL_COMMUNITY): Payer: Medicare Other | Admitting: Physical Therapy

## 2014-07-11 ENCOUNTER — Inpatient Hospital Stay (HOSPITAL_COMMUNITY): Payer: Medicare Other | Admitting: Occupational Therapy

## 2014-07-11 NOTE — Plan of Care (Signed)
Problem: RH PAIN MANAGEMENT Goal: RH STG PAIN MANAGED AT OR BELOW PT'S PAIN GOAL Pain less than 3  Outcome: Progressing     

## 2014-07-11 NOTE — Plan of Care (Signed)
Problem: RH SKIN INTEGRITY Goal: RH STG SKIN FREE OF INFECTION/BREAKDOWN With min assist  Outcome: Progressing Goal: RH STG ABLE TO PERFORM INCISION/WOUND CARE W/ASSISTANCE STG Able To Perform Incision/Wound Care With BJ'sMin Assistance.  Outcome: Progressing  Problem: RH SAFETY Goal: RH STG ADHERE TO SAFETY PRECAUTIONS W/ASSISTANCE/DEVICE STG Adhere to Safety Precautions With supervision Assistance/Device.  Outcome: Progressing  Problem: RH PAIN MANAGEMENT Goal: RH STG PAIN MANAGED AT OR BELOW PT'S PAIN GOAL Pain less than 3.  Outcome: Progressing  Problem: Communication Impairment Goal: Effective communication Outcome: Progressing

## 2014-07-11 NOTE — Progress Notes (Signed)
Occupational Therapy Session Note  Patient Details  Name: Orlie Dakinau [place] MRN: [id number] Date of Birth: June 10, 1946  Today's Date: 07/11/2014 OT Individual Time: 1430-1510 OT Individual Time Calculation (min): 40 min   Short Term Goals: Week 1:  OT Short Term Goal 1 (Week 1): STG=LTG  Skilled Therapeutic Interventions/Progress Updates:  Patient sleeping in bed upon arrival with grandson present.  Engaged in toilet transfer, toileting, walker safety with functional mobility such as open/close doors, methods for transporting items while using walker, ambulated to and from therapy kitchen, completed simple kitchen task.  Focused session on maintaining PWB through LLE with all above tasks, activity tolerance, dynamic balance, walker safety.  Issued walker bag to improve safety while transporting items.  Patient supervision for kitchen tasks.  Therapy Documentation Precautions:  Precautions Precautions: Fall Restrictions Weight Bearing Restrictions: Yes LLE Weight Bearing: Partial weight bearing LLE Partial Weight Bearing Percentage or Pounds: 50% PWB  See FIM for current functional status  Therapy/Group: Individual Therapy  Hiedi Touchton 07/11/2014, 3:37 PM

## 2014-07-11 NOTE — Progress Notes (Signed)
Physical Therapy Session Note  Patient Details  Name: [name] MRN: [id number] Date of Birth: 1945/12/14  Today's Date: 07/11/2014 PT Individual Time: 1028-1101 PT Individual Time Calculation (min): 33 min   Short Term Goals: Week 1:  PT Short Term Goal 1 (Week 1): = LTG secondary to short LOS  Skilled Therapeutic Interventions/Progress Updates:   Pt received in bed with grandson present.  Pt performed supine > sit and stand pivot to w/c with RW supervision.  Transported to gym in w/c due to time with total A.  In gym adjusted pt RW for home use to pt height.  Discussed need for w/c for longer distances but grandson concerned about paying for w/c secondary to her insurance not covering equipment; will f/u with Child psychotherapistsocial worker.  Performed stair training up/down 3 stairs with pt's RW x 3 reps first with grandson observing and then grandson providing verbal cues for sequencing and min A to stabilize RW.  Pt continues to require mod-max verbal and visual cues to safely place RW and for safe stepping sequence.  Returned to room ambulating with RW and supervision with pt demonstrating good adherence to [facility].  Pt left with grandson in w/c.  Therapy Documentation Precautions:  Precautions Precautions: Fall Restrictions Weight Bearing Restrictions: Yes LLE Weight Bearing: Partial weight bearing LLE Partial Weight Bearing Percentage or Pounds: 50% PWB Vital Signs: Therapy Vitals Temp: 98.3 F (36.8 C) Temp Source: Oral Pulse Rate: 77 Resp: 16 BP: (!) 114/47 mmHg Patient Position (if appropriate): Lying Oxygen Therapy SpO2: 98 % O2 Device: Not Delivered Pain:  No c/o pain Locomotion : Ambulation Ambulation/Gait Assistance: 5: Supervision   See FIM for current functional status  Therapy/Group: Individual Therapy  Edman CircleHall, Mariama Saintvil Rolling Hills HospitalFaucette 07/11/2014, 4:38 PM

## 2014-07-11 NOTE — Plan of Care (Signed)
Problem: RH SKIN INTEGRITY Goal: RH STG ABLE TO PERFORM INCISION/WOUND CARE W/ASSISTANCE STG Able To Perform Incision/Wound Care With Min Assistance.  Outcome: Progressing     

## 2014-07-11 NOTE — Progress Notes (Signed)
Social Work Patient ID: [name], female   DOB: [birth year], 68 y.o.   MRN: [id number] Met with pt and daughter who was here to discuss discharge plans, agreeable to rolling walker and follow up therpaies. Have made referral to [facility] for both -follow up and equipment.  All very pleased with how well pt is progressing.  Family attended  Therapies with pt and learned her care.

## 2014-07-11 NOTE — Progress Notes (Signed)
Subjective/Complaints: No pain at rest Denies any breathing issues Objective:   Review of Systems - Negative except above Vital Signs: Blood pressure 134/53, pulse 82, temperature 98.2 F (36.8 C), temperature source Oral, resp. rate 17, weight 43.046 kg (94 lb 14.4 oz), SpO2 100 %. No results found. Results for orders placed or performed during the hospital encounter of 07/08/14 (from the past 72 hour(s))  Culture, Urine     Status: None   Collection Time: 07/08/14  7:28 PM  Result Value Ref Range   Specimen Description URINE, CLEAN CATCH    Special Requests NONE    Culture  Setup Time      07/08/2014 19:58 Performed at [place] Performed at [facility]     Culture NO GROWTH Performed at [facility]     Report Status 07/09/2014 FINAL   Urinalysis, Routine w reflex microscopic     Status: Abnormal   Collection Time: 07/08/14  7:28 PM  Result Value Ref Range   Color, Urine YELLOW YELLOW   APPearance CLEAR CLEAR   Specific Gravity, Urine <1.005 (L) 1.005 - 1.030   pH 6.5 5.0 - 8.0   Glucose, UA NEGATIVE NEGATIVE mg/dL   Hgb urine dipstick NEGATIVE NEGATIVE   Bilirubin Urine NEGATIVE NEGATIVE   Ketones, ur NEGATIVE NEGATIVE mg/dL   Protein, ur NEGATIVE NEGATIVE mg/dL   Urobilinogen, UA 0.2 0.0 - 1.0 mg/dL   Nitrite NEGATIVE NEGATIVE   Leukocytes, UA TRACE (A) NEGATIVE  Urine microscopic-add on     Status: None   Collection Time: 07/08/14  7:28 PM  Result Value Ref Range   Squamous Epithelial / LPF RARE RARE   WBC, UA 3-6 <3 WBC/hpf   Bacteria, UA RARE RARE   Urine-Other AMORPHOUS URATES/PHOSPHATES   CBC WITH DIFFERENTIAL     Status: Abnormal   Collection Time: 07/09/14  5:41 AM  Result Value Ref Range   WBC 5.8 4.0 - 10.5 K/uL   RBC 3.55 (L) 3.87 - 5.11 MIL/uL   Hemoglobin 10.2 (L) 12.0 - 15.0 g/dL   HCT 31.7 (L) 36.0 - 46.0 %   MCV 89.3 78.0 - 100.0 fL   MCH 28.7 26.0 - 34.0 pg   MCHC 32.2 30.0 - 36.0 g/dL    RDW 15.1 11.5 - 15.5 %   Platelets 285 150 - 400 K/uL   Neutrophils Relative % 59 43 - 77 %   Neutro Abs 3.4 1.7 - 7.7 K/uL   Lymphocytes Relative 28 12 - 46 %   Lymphs Abs 1.6 0.7 - 4.0 K/uL   Monocytes Relative 10 3 - 12 %   Monocytes Absolute 0.6 0.1 - 1.0 K/uL   Eosinophils Relative 3 0 - 5 %   Eosinophils Absolute 0.2 0.0 - 0.7 K/uL   Basophils Relative 0 0 - 1 %   Basophils Absolute 0.0 0.0 - 0.1 K/uL  Comprehensive metabolic panel     Status: Abnormal   Collection Time: 07/09/14  5:41 AM  Result Value Ref Range   Sodium 142 137 - 147 mEq/L   Potassium 4.4 3.7 - 5.3 mEq/L   Chloride 103 96 - 112 mEq/L   CO2 28 19 - 32 mEq/L   Glucose, Bld 94 70 - 99 mg/dL   BUN 18 6 - 23 mg/dL   Creatinine, Ser 0.51 0.50 - 1.10 mg/dL   Calcium 8.7 8.4 - 10.5 mg/dL   Total Protein 6.4 6.0 - 8.3 g/dL  Albumin 2.7 (L) 3.5 - 5.2 g/dL   AST 92 (H) 0 - 37 U/L   ALT 125 (H) 0 - 35 U/L   Alkaline Phosphatase 122 (H) 39 - 117 U/L   Total Bilirubin 0.7 0.3 - 1.2 mg/dL   GFR calc non Af Amer >90 >90 mL/min   GFR calc Af Amer >90 >90 mL/min    Comment: (NOTE) The eGFR has been calculated using the CKD EPI equation. This calculation has not been validated in all clinical situations. eGFR's persistently <90 mL/min signify possible Chronic Kidney Disease.    Anion gap 11 5 - 15     HEENT: normal Cardio: RRR Resp: CTA B/L and unlabored GI: BS positive and NT, ND Extremity:  Pulses positive and No Edema Skin:   Intact and Wound C/D/I Neuro: Abnormal Motor 5/5 in BUE and RLE, 2- Left HF, KE pain inhibition Musc/Skel:  Swelling Left thigh Gen NAD   Assessment/Plan: 1. Functional deficits secondary to  left mid shaft femur fracture, PWB which require 3+ hours per day of interdisciplinary therapy in a comprehensive inpatient rehab setting. Physiatrist is providing close team supervision and 24 hour management of active medical problems listed below. Physiatrist and rehab team continue to  assess barriers to discharge/monitor patient progress toward functional and medical goals. Plan d/c 11/23 FIM: FIM - Bathing Bathing Steps Patient Completed: Chest, Right Arm, Left Arm, Abdomen, Front perineal area, Buttocks, Right upper leg, Left upper leg, Right lower leg (including foot) Bathing: 4: Min-Patient completes 8-9 3f10 parts or 75+ percent  FIM - Upper Body Dressing/Undressing Upper body dressing/undressing steps patient completed: Put head through opening of pull over shirt/dress, Thread/unthread left sleeve of pullover shirt/dress, Thread/unthread right sleeve of pullover shirt/dresss, Pull shirt over trunk Upper body dressing/undressing: 5: Set-up assist to: Obtain clothing/put away FIM - Lower Body Dressing/Undressing Lower body dressing/undressing steps patient completed: Thread/unthread right pants leg, Pull pants up/down, Don/Doff right sock, Thread/unthread left pants leg, Don/Doff left sock, Thread/unthread right underwear leg, Thread/unthread left underwear leg, Pull underwear up/down Lower body dressing/undressing: 4: Steadying Assist  FIM - Toileting Toileting steps completed by patient: Adjust clothing prior to toileting, Performs perineal hygiene, Adjust clothing after toileting Toileting: 4: Steadying assist  FIM - TRadio producerDevices: Grab bars, WInsurance account managerTransfers: 4-To toilet/BSC: Min A (steadying Pt. > 75%), 4-From toilet/BSC: Min A (steadying Pt. > 75%)  FIM - Bed/Chair Transfer Bed/Chair Transfer Assistive Devices: Walker, Arm rests Bed/Chair Transfer: 5: Supine > Sit: Supervision (verbal cues/safety issues), 4: Bed > Chair or W/C: Min A (steadying Pt. > 75%), 4: Chair or W/C > Bed: Min A (steadying Pt. > 75%), 5: Sit > Supine: Supervision (verbal cues/safety issues)  FIM - Locomotion: Wheelchair Distance: 150 Locomotion: Wheelchair: 0: Activity did not occur (Pt ambulatory on unit) FIM - Locomotion:  Ambulation Locomotion: Ambulation Assistive Devices: WAdministratorAmbulation/Gait Assistance: 5: Supervision Locomotion: Ambulation: 5: Travels 150 ft or more with supervision/safety issues  Comprehension Comprehension Mode: Auditory Comprehension: 7-Follows complex conversation/direction: With no assist  Expression Expression Mode: Verbal Expression: 7-Expresses complex ideas: With no assist  Social Interaction Social Interaction: 7-Interacts appropriately with others - No medications needed.  Problem Solving Problem Solving: 7-Solves complex problems: Recognizes & self-corrects  Memory Memory: 7-Complete Independence: No helper  Medical Problem List and Plan: 1. Functional deficits secondary to left mid shaft femur fracture 2. DVT Prophylaxis/Anticoagulation: Pharmaceutical: Lovenox 3. Pain Management: 5/10 Will continue hydrocodone prn but premedicate prior to  therapy sessions. Ice left hip tid and prn 4. Mood: LCSW to follow with for evaluation and support.  5. Neuropsych: This patient is capable of making decisions on her own behalf. 6. Skin/Wound Care: Rehab RN to monitor skin and wound daily. Routine pressure relief measures.  7. Fluids/Electrolytes/Nutrition: Monitor I/O. Offer supplements if intake poor. Add protein supplement to help promote healing.  8. ABLA: Continue iron supplement. Recheck labs in am.  9. Hypokalemia: Likely dilutional. Discontinue IVF. Add oral supplement and recheck labs in am.  10 HTN: Will monitor every 8 hours. Continue Norvasc daily.  11. Constipation:  Will adjust bowel program.   LOS (Days) 3 A FACE TO FACE EVALUATION WAS PERFORMED  Colbi Staubs E 07/11/2014, 7:14 AM

## 2014-07-11 NOTE — Plan of Care (Signed)
Problem: RH SKIN INTEGRITY Goal: RH STG SKIN FREE OF INFECTION/BREAKDOWN With min.assist  Outcome: Progressing     

## 2014-07-11 NOTE — Plan of Care (Signed)
Problem: RH SAFETY Goal: RH STG ADHERE TO SAFETY PRECAUTIONS W/ASSISTANCE/DEVICE STG Adhere to Safety Precautions With supervision Assistance/Device.  Outcome: Progressing     

## 2014-07-11 NOTE — Progress Notes (Signed)
Social Work Lucy Chrisebecca G Aiyanah Kalama, LCSW Social Worker Signed  Patient Care Conference 07/09/2014  1:45 PM    Expand All Collapse All   Inpatient RehabilitationTeam Conference and Plan of Care Update Date: 07/09/2014   Time: 11;57 AM     Patient Name: [name]      Medical Record Number: [id number]  Date of Birth: March 27, 1946 Sex: Female         Room/Bed: 4W21C/4W21C-01 Payor Info: Payor: MEDICARE / Plan: MEDICARE PART A / Product Type: *No Product type* /    Admitting Diagnosis: L femur fx    Admit Date/Time:  07/08/2014  5:14 PM Admission Comments: No comment available   Primary Diagnosis:  Fracture of shaft of left femur Principal Problem: Fracture of shaft of left femur    Patient Active Problem List     Diagnosis  Date Noted   .  Postoperative anemia due to acute blood loss  07/08/2014   .  Essential hypertension  07/08/2014   .  Fall  07/08/2014   .  Expected blood loss anemia  07/03/2014   .  Fracture of shaft of left femur  07/02/2014     Expected Discharge Date: Expected Discharge Date: 07/14/14  Team Members Present: Physician leading conference: Dr. Claudette LawsAndrew Kirsteins Social Worker Present: Dossie DerBecky Rutilio Yellowhair, LCSW Nurse Present: Carmie EndAngie Joyce, RN PT Present: Wanda Plumparoline Cook, PT;Blair [name], PT OT Present: Rosalio LoudSarah Hoxie, OT SLP Present: Fae PippinMelissa Bowie, SLP PPS Coordinator present : Tora DuckMarie Noel, RN, CRRN        Current Status/Progress  Goal  Weekly Team Focus   Medical     low BP this am,   Maintain, BPs in range  check orthostatics   Bowel/Bladder     Continent of bowel and bladder  Mod I  Cont. plan of care   Swallow/Nutrition/ Hydration       na         ADL's       eval pending         Mobility     supervision-mod I  mod I except min A stairs  functional mobility, adherance to WB status, LE strengthening/ROM, balance, pt/family education   Communication       na         Safety/Cognition/ Behavioral Observations      no unsafe behaviors         Pain     Scheduled  1 norco BID with PRN tramadol q6hrs   3 or less  medicate scheduled medications and PRN/ prior to therapy    Skin     3 Incisions to left hip with foam CDI   remain free of skin breakdown and free from infection   assess skin q shift; change dressing as neede       *See Care Plan and progress notes for long and short-term goals.    Barriers to Discharge:  language barrier     Possible Resolutions to Barriers:   pro or family interpreter     Discharge Planning/Teaching Needs:     [place]family can provide 24 hr if needed.      Team Discussion:    New eval doing well with PWB-grandson staying here with her.  Does need an interpreter for communication barrier. Goals set at mod/i level   Revisions to Treatment Plan:    New eval    Continued Need for Acute Rehabilitation Level of Care: The patient requires daily medical  management by a physician with specialized training in physical medicine and rehabilitation for the following conditions: Daily direction of a multidisciplinary physical rehabilitation program to ensure safe treatment while eliciting the highest outcome that is of practical value to the patient.: Yes Daily medical management of patient stability for increased activity during participation in an intensive rehabilitation regime.: Yes Daily analysis of laboratory values and/or radiology reports with any subsequent need for medication adjustment of medical intervention for : Post surgical problems;Other  Lucy ChrisDupree, Haliey Romberg G 07/11/2014, 1:46 PM                  Patient ID: [name], female   DOB: August 04, 1946, 68 y.o.   MRN: [id number]

## 2014-07-11 NOTE — Progress Notes (Signed)
Physical Therapy Session Note  Patient Details  Name: Orlie Dakinau Fooks MRN: [id number] Date of Birth: 06-13-1946  Today's Date: 07/11/2014 PT Individual Time: 0905-0935 PT Individual Time Calculation (min): 30 min   Short Term Goals: Week 1:  PT Short Term Goal 1 (Week 1): = LTG secondary to short LOS  Skilled Therapeutic Interventions/Progress Updates:   Pt received sitting edge of bed, grandson present for session to interpret. Pt at supervision level for all transfers, ambulation, and bed mobility this session. Pt requesting to use bathroom, ambulated to bathroom and performed hygiene and clothing management with S. Pt washed hands at sink in standing and ambulated to therapy gym using RW and able to maintain PWB precautions. Pt transferred sit <> supine on mat table at lowest setting to simulate home environment and performed supine therex for LLE strengthening/ROM x 10 each exercise: quad sets, heel slides, hip abduction/adduction, and active assisted SLR. Pt ambulated back to room and left sitting edge of bed with needs within reach and grandson present.    Therapy Documentation Precautions:  Precautions Precautions: Fall Restrictions Weight Bearing Restrictions: Yes LLE Weight Bearing: Partial weight bearing LLE Partial Weight Bearing Percentage or Pounds: 50% PWB Pain: Pain Assessment Pain Assessment: 0-10 Pain Score: 4  Pain Type: Surgical pain Pain Location: Leg Pain Orientation: Left Pain Descriptors / Indicators: Aching Pain Onset: On-going  See FIM for current functional status  Therapy/Group: Individual Therapy  Kerney ElbeVarner, Brookes Craine A 07/11/2014, 9:51 AM

## 2014-07-11 NOTE — Progress Notes (Signed)
Occupational Therapy Session Note  Patient Details  Name: [name] MRN: [id number] Date of Birth: 01/26/1946  Today's Date: 07/11/2014 OT Individual Time: 1300-1330 OT Individual Time Calculation (min): 30 min    Short Term Goals: Week 1:  OT Short Term Goal 1 (Week 1): STG=LTG  Skilled Therapeutic Interventions/Progress Updates:    Pt seen for 1:1 OT session with focus on functional mobility, standing balance, and activity tolerance. Pt ambulated room>therapy gym at supervision level using RW. Engaged in dynamic standing balance activity of horse shoe game while reaching out of BOS at supervision level. Pt required rest breaks between each game and before ambulating back to room. Pt left supine in bed with all needs in reach.   Therapy Documentation Precautions:  Precautions Precautions: Fall Restrictions Weight Bearing Restrictions: Yes LLE Weight Bearing: Partial weight bearing LLE Partial Weight Bearing Percentage or Pounds: 50% PWB General:   Vital Signs: Therapy Vitals Temp: 98.3 F (36.8 C) Temp Source: Oral Pulse Rate: 77 Resp: 16 BP: (!) 114/47 mmHg Patient Position (if appropriate): Lying Oxygen Therapy SpO2: 98 % O2 Device: Not Delivered Pain: Reported 5/10 pain in LLE, declined pain meds or ice  See FIM for current functional status  Therapy/Group: Individual Therapy  Daneil Danerkinson, Emerald Shor N 07/11/2014, 2:31 PM

## 2014-07-11 NOTE — Progress Notes (Signed)
Occupational Therapy Session Note  Patient Details  Name: [name] MRN: [id number] Date of Birth: [birth year]  Today's Date: 07/11/2014 OT Individual Time: 1100-1200 OT Individual Time Calculation (min): 60 min    Short Term Goals: Week 1:  OT Short Term Goal 1 (Week 1): STG=LTG  Skilled Therapeutic Interventions/Progress Updates:    Engaged in ADL retraining with focus on use of AE to increase safety and independence with self-care tasks of bathing and dressing.  Issued pt a long handled sponge to assist with LB bathing, pt also utilized sponge end to assist in doffing clothes and socks.  Pt overall mod I with bathing at sit > stand level in room shower.  Distant supervision with mobility with RW, pt able to recall PWB status with all mobility.  Pt ambulated to therapy gym with RW.  In gym engaged in BUE strengthening with completing block pushups and 4# dowel rod exercises.  Pt's grandson present throughout session to provide interpretation.  Therapy Documentation Precautions:  Precautions Precautions: Fall Restrictions Weight Bearing Restrictions: Yes LLE Weight Bearing: Partial weight bearing LLE Partial Weight Bearing Percentage or Pounds: 50% PWB General:   Vital Signs: Therapy Vitals BP: (!) 120/54 mmHg Pain: Pain Assessment Pain Assessment: 0-10 Pain Score: 4  Pain Type: Surgical pain Pain Location: Leg Pain Orientation: Left Pain Descriptors / Indicators: Aching Pain Onset: On-going  See FIM for current functional status  Therapy/Group: Individual Therapy  Rosalio LoudHOXIE, Archibald Marchetta 07/11/2014, 12:20 PM

## 2014-07-12 ENCOUNTER — Inpatient Hospital Stay (HOSPITAL_COMMUNITY): Payer: Medicare Other | Admitting: *Deleted

## 2014-07-12 ENCOUNTER — Inpatient Hospital Stay (HOSPITAL_COMMUNITY): Payer: Medicare Other

## 2014-07-12 NOTE — Progress Notes (Signed)
Physical Therapy Session Note  Patient Details  Name: [name] MRN: [id number] Date of Birth: March 20, 1946  Today's Date: 07/12/2014 PT Individual Time: 1100-1200 PT Individual Time Calculation (min): 60 min   Short Term Goals: Week 1:  PT Short Term Goal 1 (Week 1): = LTG secondary to short LOS  Skilled Therapeutic Interventions/Progress Updates:  1:1. Pt received amb out of bathroom with supervision provided by RN, therapist taking over. Pt with report of 6/10 pain in L LE at start of session, RN just provided pain medication. Pt reporting 5/10 pain at end of session. Focus this session on functional endurance, safety during functional transfers and mobility, family education with pt's grandson and therex. Pt transported from room>therapy apartment via w/c for energy conservation. Pt req distant supervision for overall household mobility, t/f sup<>sit in a standard bed, furniture transfer and ambulation with RW as well as during car t/f. Pt's grandson providing safe min A for negotiation up/down 3 steps with use of RW. Pt and grandson educated on pt's target goal levels, that she will be at [facility] Iiimod(I) level for household transfers and ambulation with RW, however, will req supervision for safe completion of car t/f and min A for stair negotiation. Further education provided regarding d/c process and HH PT. Both verbalized understanding.   Pt completed supine and seated therex to target L LE strength, endurance and ROM, handout provided. Exercises included: 2x10 reps of ankle pumps, heel slides, SLR, hip abd/add, marching and LAQ. Pt with good tolerance overall. Education provided regarding edema management.   Pt req overall supervision for ambulation 125'x2 and 180'x1 with RW, excellent ability to maintain PWB L LE without cues. Pt req supervision for t/f sit>sup at end of session, left semi-reclined in bed with all needs in reach, bed alarm on, ice pack in place and grandson in room.   Therapy  Documentation Precautions:  Precautions Precautions: Fall Restrictions Weight Bearing Restrictions: Yes LLE Weight Bearing: Partial weight bearing LLE Partial Weight Bearing Percentage or Pounds: 50% PWB  See FIM for current functional status  Therapy/Group: Individual Therapy  Denzil Cruz, Karla Amara S 07/12/2014, 12:09 PM

## 2014-07-12 NOTE — Progress Notes (Signed)
Patient ID: [name], female   DOB: August 07, 1946, 68 y.o.   MRN: [id number]  07/12/14.  Subjective/Complaints:  68 year old Asian female admitted with functional deficits secondary to left mid shaft femur fracture. Remains fairly comfortable.  Son at bedside to assist with communication.  Some pain through the night but unclear whether patient asked for analgesic   Past Medical History  Diagnosis Date  . Hypertension   . Hyperlipidemia     Intake/Output Summary (Last 24 hours) at 07/12/14 0924 Last data filed at 07/12/14 0800  Gross per 24 hour  Intake   1080 ml  Output      0 ml  Net   1080 ml     Patient Vitals for the past 24 hrs:  BP Temp Temp src Pulse Resp SpO2  07/12/14 0532 (!) 147/64 mmHg 98.5 F (36.9 C) Oral 74 18 100 %  07/11/14 1416 (!) 114/47 mmHg 98.3 F (36.8 C) Oral 77 16 98 %    Objective:   Review of Systems - Negative except above Vital Signs: Blood pressure 147/64, pulse 74, temperature 98.5 F (36.9 C), temperature source Oral, resp. rate 18, weight 94 lb 14.4 oz (43.046 kg), SpO2 100 %. No results found. No results found for this or any previous visit (from the past 72 hour(s)).   HEENT: normal Cardio: RRR Resp: CTA B/L and unlabored GI: BS positive and NT, ND Extremity:  Pulses positive and No Edema Skin:   Intact and Wound C/D/I Neuro: Abnormal Motor 5/5 in BUE and RLE, 2- Left HF, KE pain inhibition Musc/Skel:  Swelling Left thigh with resolving ecchymoses Gen NAD   Assessment/Plan: 1. Functional deficits secondary to left mid shaft femur fracture 2. DVT Prophylaxis/Anticoagulation: Pharmaceutical: Lovenox 3. Pain Management: 5/10 Will continue hydrocodone prn but premedicate prior to therapy sessions. Ice left hip tid and prn  4. HTN: Will monitor every 8 hours. Continue Norvasc daily.    LOS (Days) 4 A FACE TO FACE EVALUATION WAS PERFORMED  Rogelia BogaKWIATKOWSKI,PETER FRANK 07/12/2014, 9:22 AM

## 2014-07-12 NOTE — Progress Notes (Signed)
Occupational Therapy Session Note  Patient Details  Name: [name] MRN: [id number] Date of Birth: 08/01/1946  Today's Date: 07/12/2014 OT Individual Time:  -   0830-0930   (60MINs)      Short Term Goals: Week 1:  OT Short Term Goal 1 (Week 1): STG=LTG  Skilled Therapeutic Interventions/Progress Updates:     1st session:   Pt seen for 1:1 OT session with focus on functional mobility, standing balance, and activity tolerance during bathing and dressing session.  Utilized LH sponge during bathing and helped to retrieve items off floor.  Able to stand and balance with one hand on grab bar and use other hand to wash Lower half.  Pt dressed at set up level.  Ambulated out to sink and performed grooming in sitting..  TRansferred from wc to bed with contact guard assit and rested at end of session.   Pt's grandson present throughout session to provide interpretation  .    2nd session:  Group Tag  Time:  1400-1500  (60 min) Pain:  none Group session:  Pt. Participated in Tag group.  Addressed functional mobility, balance, and endurance.  Pt. Adhered to WB precautions.  Ambulated with RW in day room 60 feet x2 and then x2 to room.  Pt. Participated in balance activity of bocce ball with No LOB.  Therapy Documentation Precautions:  Precautions Precautions: Fall Restrictions Weight Bearing Restrictions: Yes LLE Weight Bearing: Partial weight bearing LLE Partial Weight Bearing Percentage or Pounds: 50%    Pain:  1st session:    6/10 left leg .  After pain pills      See FIM for current functional status  Therapy/Group: Individual Therapy  Humberto Sealsdwards, Ronisha Herringshaw J 07/12/2014, 9:16 AM

## 2014-07-13 ENCOUNTER — Inpatient Hospital Stay (HOSPITAL_COMMUNITY): Payer: Medicare Other | Admitting: Physical Therapy

## 2014-07-13 ENCOUNTER — Inpatient Hospital Stay (HOSPITAL_COMMUNITY): Payer: Medicare Other | Admitting: *Deleted

## 2014-07-13 DIAGNOSIS — S72302E Unspecified fracture of shaft of left femur, subsequent encounter for open fracture type I or II with routine healing: Secondary | ICD-10-CM

## 2014-07-13 NOTE — Progress Notes (Signed)
Physical Therapy Session Note  Patient Details  Name: [name] MRN: [id number] Date of Birth: [birth year]  Today's Date: 07/13/2014 PT Individual Time:0800-0900, 1300-1400    Short Term Goals: Week 1:  PT Short Term Goal 1 (Week 1): = LTG secondary to short LOS  Skilled Therapeutic Interventions/Progress Updates:    Pt demonstrates reliance on valsalva strategies in functional mobility for increased stability. Pt able to improve ventilation with cueing, but difficulty generalizing to all tasks, requiring reinforcement. Pt with improved knee flexion ROM to 100 degrees in pm session s/p manual techniques. Pt would continue to benefit from skilled PT services to increase functional mobility.  Therapy Documentation Precautions:  Precautions Precautions: Fall Restrictions Weight Bearing Restrictions: Yes LLE Weight Bearing: Partial weight bearing LLE Partial Weight Bearing Percentage or Pounds: 50% Vital Signs: Therapy Vitals Temp: 98 F (36.7 C) Temp Source: Oral Pulse Rate: 68 Resp: 18 BP: (!) 145/62 mmHg Patient Position (if appropriate): Lying Oxygen Therapy SpO2: 100 % O2 Device: Not Delivered Pain: Pain Assessment Pain Assessment: 0-10 Pain Score: 6  Pain Location: Leg Pain Orientation: Left Pain Intervention(s):  (graded activity, repositioning, manual techniques) Mobility:  Transfers SBA with cues for safety, ventilation Locomotion : Ambulation Ambulation/Gait Assistance: 5: Supervision with cues for step length and evntilation Other Treatments:  Tx 1:Pt performs glute sets, hip add isometrics, seated knee flexion, hip ER/IR AROM supine, standing pre-gait with lateral weight shifts, resisted ankle DF 2x10. Pt educated on ventilatory strategies and avoidance of valsalva. Pt educated on pain science. Pt positioned in neutral spine posture. 150'x2 in am session  Tx 2: Pt performs LAQs, quad sets, seated knee flexion,  Standing hip ext, standing HS curls, stand hip abd,  heel raises, hip ER AROM in sitting 2x10 each. Pt educated on importance of increasing RLE mobility and rehab plan. 200'x2 gait in pm session. Transfers x15 in session. LLE myofascial release including HS, IT band.  See FIM for current functional status  Therapy/Group: Individual Therapy  Christia ReadingKinney, Kemonte Ullman G 07/13/2014, 8:42 AM

## 2014-07-13 NOTE — Progress Notes (Signed)
Occupational Therapy Session Note  Patient Details  Name: Orlie Dakinau Mcomber MRN: [id number] Date of Birth: October 30, 1945  Today's Date: 07/13/2014 OT Individual Time:  -   0900-1000  (60 min)      Short Term Goals: Week 1:  OT Short Term Goal 1 (Week 1): STG=LTG Week 2:     Skilled Therapeutic Interventions/Progress Updates:      Pt. Sitting in wc upon OT arrival.  Grandson present.  She ambulated to retireve clothes with RW and SBA.  Ambulated into shower and doffed clothes using LH sponge end.  Provided reacher which she liked.  Stood in shower for part of bathing with SBA.    Dressed self in bathroom with reacher Prn, but able to reach down for socks.  Pt ambulated out to bed and trandferred back to bed at end of session.  Pt did very well with no LOB and no unsafe behaviors.    Therapy Documentation Precautions:  Precautions Precautions: Fall Restrictions Weight Bearing Restrictions: Yes LLE Weight Bearing: Partial weight bearing LLE Partial Weight Bearing Percentage or Pounds: 50%      Pain: Pain Assessment Pain Score: Asleep         See FIM for current functional status  Therapy/Group: Individual Therapy  Humberto Sealsdwards, Cedar Roseman J 07/13/2014, 12:52 PM

## 2014-07-13 NOTE — Progress Notes (Signed)
Patient ID: Karla Dakinau Mole, female   DOB: 05-May-1946, 68 y.o.   MRN: [id number]  Patient ID: [name], female   DOB: 05-May-1946, 68 y.o.   MRN: [id number]  07/13/14.  Subjective/Complaints:  68 year old Asian female admitted with functional deficits secondary to left mid shaft femur fracture. Remains fairly comfortable.  Son at bedside to assist with communication.  Some L thigh pain through the night but comfortable this morning.     Past Medical History  Diagnosis Date  . Hypertension   . Hyperlipidemia     Intake/Output Summary (Last 24 hours) at 07/13/14 0839 Last data filed at 07/12/14 1800  Gross per 24 hour  Intake    720 ml  Output      0 ml  Net    720 ml     Patient Vitals for the past 24 hrs:  BP Temp Temp src Pulse Resp SpO2  07/13/14 0448 (!) 145/62 mmHg 98 F (36.7 C) Oral 68 18 100 %  07/12/14 1459 (!) 135/55 mmHg 98 F (36.7 C) Oral 90 18 99 %    Objective:   Review of Systems - Negative except above Vital Signs: Blood pressure 145/62, pulse 68, temperature 98 F (36.7 C), temperature source Oral, resp. rate 18, weight 94 lb 14.4 oz (43.046 kg), SpO2 100 %. No results found. No results found for this or any previous visit (from the past 72 hour(s)).   HEENT: normal Cardio: RRR Resp: CTA B/L and unlabored GI: BS positive and NT, ND Extremity:  Pulses positive and No Edema Skin:   Intact and Wound C/D/I Neuro: Abnormal Motor 5/5 in BUE and RLE, 2- Left HF, KE pain inhibition Musc/Skel:  Swelling Left thigh with resolving ecchymoses.  No tenderness Gen NAD   Assessment/Plan: 1. Functional deficits secondary to left mid shaft femur fracture 2. DVT Prophylaxis/Anticoagulation: Pharmaceutical: Lovenox 3. Pain Management: 5/10 Will continue hydrocodone prn but premedicate prior to therapy sessions. Ice left hip tid and prn  4. HTN: Will monitor every 8 hours. Continue Norvasc daily.    LOS (Days) 5 A FACE TO FACE EVALUATION WAS  PERFORMED  Rogelia BogaKWIATKOWSKI,[employer] FRANK 07/13/2014, 8:39 AM

## 2014-07-13 NOTE — Progress Notes (Signed)
Patient had increased pain to left hip and required Vicodin 1 tab given at 2007 and Ultram 50 mg at 2348 as well as an ice pack applied directly to hip for pain. Patient was unable to ambulate to the bathroom after Ultram was given do to the pain and requested to use the bedpan. Patient managed to lift buttocks off the bed in order to place the bedpan herself with min assistance. Patient fell asleep shortly after that. Otherwise no other complaints. Cont plan of care. Korbin Mapps, Phill MutterMelissa Rebecca

## 2014-07-14 ENCOUNTER — Inpatient Hospital Stay (HOSPITAL_COMMUNITY): Payer: Medicare Other | Admitting: Physical Therapy

## 2014-07-14 ENCOUNTER — Inpatient Hospital Stay (HOSPITAL_COMMUNITY): Payer: Medicare Other | Admitting: Occupational Therapy

## 2014-07-14 DIAGNOSIS — E876 Hypokalemia: Secondary | ICD-10-CM | POA: Diagnosis present

## 2014-07-14 MED ORDER — HYDROCODONE-ACETAMINOPHEN 5-325 MG PO TABS: 1.0000 | ORAL_TABLET | ORAL | Status: AC | PRN

## 2014-07-14 MED ORDER — ACETAMINOPHEN 325 MG PO TABS: 325.0000 mg | ORAL_TABLET | ORAL | Status: AC | PRN

## 2014-07-14 MED ORDER — FERROUS SULFATE 325 (65 FE) MG PO TABS: 325.0000 mg | ORAL_TABLET | Freq: Two times a day (BID) | ORAL | Status: AC

## 2014-07-14 MED ORDER — AMLODIPINE BESYLATE 5 MG PO TABS: 2.5000 mg | ORAL_TABLET | Freq: Every day | ORAL | Status: AC

## 2014-07-14 MED ORDER — ASPIRIN EC 81 MG PO TBEC: 81.0000 mg | DELAYED_RELEASE_TABLET | Freq: Every day | ORAL | Status: AC

## 2014-07-14 MED ORDER — METHOCARBAMOL 500 MG PO TABS: 500.0000 mg | ORAL_TABLET | Freq: Four times a day (QID) | ORAL | Status: AC | PRN

## 2014-07-14 MED ORDER — POLYETHYLENE GLYCOL 3350 17 G PO PACK: 17.0000 g | PACK | Freq: Every day | ORAL | Status: AC

## 2014-07-14 NOTE — Plan of Care (Signed)
Problem: RH Ambulation Goal: LTG Patient will ambulate in controlled environment (PT) LTG: Patient will ambulate in a controlled environment, # of feet with assistance (PT).  Outcome: Completed/Met Date Met:  07/14/14

## 2014-07-14 NOTE — Progress Notes (Signed)
Physical Therapy Discharge Summary  Patient Details  Name: [name] MRN: [id number] Date of Birth: May 22, 1946  Today's Date: 07/14/2014 PT Individual Time: 1300-1345 PT Individual Time Calculation (min): 45 min    Patient has met 9 of 9 long term goals due to improved balance, increased strength, increased range of motion, decreased pain and ability to compensate for deficits.  Patient to discharge at an ambulatory level Modified Independent.   Patient's care partner is independent to provide the necessary physical and cognitive assistance at discharge.  Reasons goals not met: N/A; all goals met.  Recommendation:  Patient will benefit from ongoing skilled PT services in home health setting to continue to advance safe functional mobility, address ongoing impairments in functional ROM in LLE, safety/stability with higher level balance tasks, and minimize fall risk.  Equipment: youth rolling walker  Reasons for discharge: treatment goals met and discharge from hospital  Patient/family agrees with progress made and goals achieved: Yes   Skilled Therapeutic Interventions/Progress Updates Session focused on assessing/addressing stability/independence with functional mobility; see discharge evaluation below for detailed findings. Pt performed functional ambulation >300' with rolling walker with mod I in controlled and home environments and with supervision in community environments. Pt negotiated 12 stairs with rolling walker and supervision of grandson. See below for further detail concerning gait and stair negotiation trials as well as balance, bed mobility, and w/c mobility. Grandson provided safe supervision and appropriate cueing with all necessary aspects of functional mobility described above. Pt/grandson educated on goals, progress, and discharge plan. Pt and grandson verbalized understanding and were in full agreement with discharge plan.  PT  Discharge Precautions/Restrictions Precautions Precautions: Fall Restrictions Weight Bearing Restrictions: Yes LLE Weight Bearing: Partial weight bearing LLE Partial Weight Bearing Percentage or Pounds: 50% PWB Pain Pain Assessment Pain Assessment: 0-10 Pain Score: 5  Pain Type: Surgical pain Pain Location: Leg Pain Orientation: Left Pain Descriptors / Indicators: Throbbing Pain Onset: Gradual Pain Intervention(s): Ambulation/increased activity Vision/Perception  Vision - Assessment Eye Alignment: Within Functional Limits  Cognition Overall Cognitive Status: Within Functional Limits for tasks assessed Orientation Level: Oriented X4 Safety/Judgment: Appears intact Sensation Sensation Light Touch: Appears Intact Stereognosis: Appears Intact Hot/Cold: Appears Intact Proprioception: Appears Intact Coordination Gross Motor Movements are Fluid and Coordinated: Yes Fine Motor Movements are Fluid and Coordinated: Yes Motor  Motor Motor - Discharge Observations: Pt continues to demonstrate generalized weakness in LLE  Mobility Bed Mobility Bed Mobility: Supine to Sit;Sit to Supine;Scooting to [facility];Sitting - Scoot to Edge of Bed Supine to Sit: 6: Modified independent (Device/Increase time);HOB flat Sitting - Scoot to Edge of Bed: 6: Modified independent (Device/Increase time) Sit to Supine: HOB flat;6: Modified independent (Device/Increase time) Scooting to [facility]: 6: Modified independent (Device/Increase time) Transfers Transfers: Yes Sit to Stand: 6: Modified independent (Device/Increase time) Stand to Sit: 6: Modified independent (Device/Increase time) Stand Pivot Transfers: 6: Modified independent (Device/Increase time) Stand Pivot Transfer Details (indicate cue type and reason): using rolling walker Locomotion  Ambulation Ambulation: Yes Ambulation/Gait Assistance: 6: Modified independent (Device/Increase time) Ambulation Distance (Feet): 300 Feet Assistive device:  Rolling walker Gait Gait: Yes Gait Pattern: Impaired Gait Pattern: Step-to pattern (Limited by LLE (50%) PWB) Gait velocity: Self-selected gait speed= .48 m/s High Level Ambulation High Level Ambulation: Side stepping Side Stepping: Mod I using rolling walker Stairs / Additional Locomotion Stairs: Yes Stairs Assistance: 5: Supervision Stairs Assistance Details (indicate cue type and reason): Pt negotiated 12 stairs without rails using rolling walker with grandson providing supervision, manual stabilization of rolling  walker, and cueing for sequencing/technique,  Stair Management Technique: No rails;Forwards;With walker;Step to pattern Number of Stairs: 12 Ramp: 5: Supervision;Other (comment) (using rolling walker) Curb: 5: Supervision;Other (comment) (using rolling walker) Wheelchair Mobility Wheelchair Mobility: Yes Wheelchair Assistance: 6: Modified independent (Device/Increase time) Environmental health practitioner: Both upper extremities Wheelchair Parts Management: Supervision/cueing Distance: 150  Trunk/Postural Assessment  Cervical Assessment Cervical Assessment: Within Functional Limits Thoracic Assessment Thoracic Assessment: Within Functional Limits Lumbar Assessment Lumbar Assessment: Within Functional Limits Postural Control Postural Control: Within Functional Limits  Balance Dynamic Standing Balance Dynamic Standing - Balance Support: No upper extremity supported Dynamic Standing - Level of Assistance: 6: Modified independent (Device/Increase time) Dynamic Standing - Balance Activities: Other (comment) Dynamic Standing - Comments: Pt performed ring toss without UE support x3 consecutive minutes while adhering to WB restrictions with mod I, no overt LOB. Extremity Assessment   RLE Assessment RLE Assessment: Within Functional Limits LLE Assessment LLE Assessment: Exceptions to [facility] LLE Strength LLE Overall Strength: Deficits;Due to pain LLE Overall Strength Comments: 4-/5  overall; L knee extension 3-/5 secondary to less than full ROM against gravity  See FIM for current functional status  [name], [name] 07/14/2014, 8:16 PM

## 2014-07-14 NOTE — Plan of Care (Signed)
Problem: RH Furniture Transfers Goal: LTG Patient will perform furniture transfers w/assist (OT/PT LTG: Patient will perform furniture transfers with assistance (OT/PT).  Outcome: Completed/Met Date Met:  07/14/14

## 2014-07-14 NOTE — Plan of Care (Signed)
Problem: RH Stairs Goal: LTG Patient will ambulate up and down stairs w/assist (PT) LTG: Patient will ambulate up and down # of stairs with assistance (PT)  Outcome: Completed/Met Date Met:  07/14/14

## 2014-07-14 NOTE — Plan of Care (Signed)
Problem: RH SKIN INTEGRITY Goal: RH STG ABLE TO PERFORM INCISION/WOUND CARE W/ASSISTANCE STG Able To Perform Incision/Wound Care With [employer].  Outcome: Not Applicable Date Met:  50/35/46

## 2014-07-14 NOTE — Plan of Care (Signed)
Problem: RH Wheelchair Mobility Goal: LTG Patient will propel w/c in controlled environment (PT) LTG: Patient will propel wheelchair in controlled environment, # of feet with assist (PT)  Outcome: Completed/Met Date Met:  07/14/14

## 2014-07-14 NOTE — Plan of Care (Signed)
Problem: RH PAIN MANAGEMENT Goal: RH STG PAIN MANAGED AT OR BELOW PT'S PAIN GOAL Pain less than 3.  Outcome: Not Applicable Date Met:  88/82/80 Rates pain between 5-6

## 2014-07-14 NOTE — Plan of Care (Signed)
Problem: RH Bed to Chair Transfers Goal: LTG Patient will perform bed/chair transfers w/assist (PT) LTG: Patient will perform bed/chair transfers with assistance, with/without cues (PT).  Outcome: Completed/Met Date Met:  07/14/14

## 2014-07-14 NOTE — Progress Notes (Signed)
Social Work Discharge Note Discharge Note  The overall goal for the admission was met for:   Discharge location: [facility]  Length of Stay: Yes-7 DAYS  Discharge activity level: Yes-MOD/I LEVEL  Home/community participation: Yes  Services provided included: MD, RD, PT, OT, RN, CM, Pharmacy and SW  Financial Services: Medicare  Follow-up services arranged: Home Health: [facility]-PT[facility], DME: [place] and Patient/Family has no preference for HH/DME agencies  Comments (or additional information):FAMILY STAYS HERE WITH PT AND HAVE BEN EDUCATED IN HER CARE, ALL READY TODAY  Patient/Family verbalized understanding of follow-up arrangements: Yes  Individual responsible for coordination of the follow-up plan: [facility] FAMILY  Confirmed correct DME delivered: Elease Hashimoto 07/14/2014    Elease Hashimoto

## 2014-07-14 NOTE — Plan of Care (Signed)
Problem: RH Balance Goal: LTG Patient will maintain dynamic standing balance (PT) LTG: Patient will maintain dynamic standing balance with assistance during mobility activities (PT)  Outcome: Completed/Met Date Met:  07/14/14

## 2014-07-14 NOTE — Progress Notes (Signed)
Occupational Therapy Discharge Summary  Patient Details  Name: [name] MRN: [id number] Date of Birth: 05/18/1946   Patient has met 9 of 9 long term goals due to improved activity tolerance, postural control and ability to compensate for deficits.  Patient to discharge at overall Modified Independent level.  Patient's care partner is independent to provide the necessary assistance at discharge.  Pt's grandson has been present for all sessions providing interpretation and receiving education.  Reasons goals not met: N/A  Recommendation:  Patient will not require follow up OT at this time.  Equipment: No equipment provided  Reasons for discharge: treatment goals met and discharge from hospital  Patient/family agrees with progress made and goals achieved: Yes  OT Discharge Precautions/Restrictions  Restrictions Weight Bearing Restrictions: Yes LLE Weight Bearing: Partial weight bearing LLE Partial Weight Bearing Percentage or Pounds: 50% PWB Pain Pain Assessment Pain Assessment: 0-10 Pain Score: 5  Pain Type: Surgical pain Pain Location: Leg Pain Orientation: Left Pain Descriptors / Indicators: Throbbing Pain Onset: Gradual Pain Intervention(s): Ambulation/increased activity ADL  See FIM Vision/Perception  Vision- History Baseline Vision/History: Wears glasses Vision- Assessment Vision Assessment?: No apparent visual deficits Eye Alignment: Within Functional Limits  Cognition Overall Cognitive Status: Within Functional Limits for tasks assessed Orientation Level: Oriented X4 Safety/Judgment: Appears intact Sensation Sensation Light Touch: Appears Intact Stereognosis: Appears Intact Hot/Cold: Appears Intact Proprioception: Appears Intact Coordination Gross Motor Movements are Fluid and Coordinated: Yes Fine Motor Movements are Fluid and Coordinated: Yes  Balance Dynamic Standing Balance Dynamic Standing - Balance Support: No upper extremity supported Dynamic  Standing - Level of Assistance: 6: Modified independent (Device/Increase time) Dynamic Standing - Balance Activities: Other (comment) Dynamic Standing - Comments: Pt performed ring toss without UE support x3 consecutive minutes while adhering to WB restrictions with mod I, no overt LOB. Extremity/Trunk Assessment RUE Assessment RUE Assessment: Within Functional Limits LUE Assessment LUE Assessment: Within Functional Limits  See FIM for current functional status  Lida Berkery, [facility] 07/14/2014, 3:09 PM

## 2014-07-14 NOTE — Progress Notes (Signed)
Occupational Therapy Session Note  Patient Details  Name: [name] MRN: [id number] Date of Birth: [birth year]  Today's Date: 07/14/2014 OT Individual Time: [id number] and 1400-1420 OT Individual Time Calculation (min): 60 min and 20 min   Short Term Goals: Week 1:  OT Short Term Goal 1 (Week 1): STG=LTG  Skilled Therapeutic Interventions/Progress Updates:    1) Completed ADL retraining at overall modified independent level.  Pt obtained all clothing and supplies prior to bathing at sit > stand level in room shower.  Utilized long handled sponge to wash LLE as well as to doff Lt sock.  Pt dressed in standing, sitting to thread BLE.  Re-educated pt on importance of adhering to [facility] status to ensure proper healing and necessity of sitting when completing LB dressing to ensure WB status.  Discussed use of RW with all mobility.  Pt and pt's grandson reinforced plan to remain on main level of home and to complete sponge bath until WB precautions lifted and able to take stairs more easily.  Pt's grandson present providing interpretation.  2) Finalized family education with reiteration of importance of PWB status and use of RW with all mobility.  Pt with questions regarding healing process and medications.  Pt made modified independent in room, reminding pt to utilize RW with toilet transfers and all mobility. Pt and pt's grandson with no further questions.  Therapy Documentation Precautions:  Precautions Precautions: Fall Restrictions Weight Bearing Restrictions: Yes LLE Weight Bearing: Partial weight bearing LLE Partial Weight Bearing Percentage or Pounds: 50% PWB Pain: Pain Assessment Pain Assessment: 0-10 Pain Score: 5  Pain Type: Surgical pain Pain Location: Leg Pain Orientation: Left Pain Descriptors / Indicators: Throbbing Pain Onset: Gradual Pain Intervention(s): Ambulation/increased activity  See FIM for current functional status  Therapy/Group: Individual Therapy  Rosalio LoudHOXIE,  Aram Domzalski 07/14/2014, 2:46 PM

## 2014-07-14 NOTE — Plan of Care (Signed)
Problem: RH Bed Mobility Goal: LTG Patient will perform bed mobility with assist (PT) LTG: Patient will perform bed mobility with assistance, with/without cues (PT).  Outcome: Completed/Met Date Met:  07/14/14

## 2014-07-14 NOTE — Plan of Care (Signed)
Problem: RH SAFETY Goal: RH STG ADHERE TO SAFETY PRECAUTIONS W/ASSISTANCE/DEVICE STG Adhere to Safety Precautions With supervision Assistance/Device.  Outcome: Completed/Met Date Met:  07/14/14

## 2014-07-14 NOTE — Plan of Care (Signed)
Problem: RH Car Transfers Goal: LTG Patient will perform car transfers with assist (PT) LTG: Patient will perform car transfers with assistance (PT).  Outcome: Completed/Met Date Met:  07/14/14

## 2014-07-14 NOTE — Discharge Summary (Signed)
Physician Discharge Summary  Patient ID: [name] MRN: [id number] DOB/AGE: [birth year] 68 y.o.  Admit date: 07/08/2014 Discharge date: 07/14/2014  Discharge Diagnoses:  Principal Problem:   Fracture of shaft of left femur Active Problems:   Postoperative anemia due to acute blood loss   Essential hypertension   Hypokalemia   Discharged Condition: Stable.    Labs:  Basic Metabolic Panel: BMP Latest Ref Rng 07/09/2014 07/05/2014 07/04/2014  Glucose 70 - 99 mg/dL 94 96 99  BUN 6 - 23 mg/dL 18 9 10   Creatinine 0.50 - 1.10 mg/dL 4.090.51 8.11(B0.45(L) 1.470.53  Sodium 137 - 147 mEq/L 142 143 144  Potassium 3.7 - 5.3 mEq/L 4.4 3.4(L) 3.5(L)  Chloride 96 - 112 mEq/L 103 104 108  CO2 19 - 32 mEq/L 28 26 27   Calcium 8.4 - 10.5 mg/dL 8.7 8.0(L) 7.4(L)     CBC: CBC Latest Ref Rng 07/09/2014 07/05/2014 07/04/2014  WBC 4.0 - 10.5 K/uL 5.8 6.5 5.5  Hemoglobin 12.0 - 15.0 g/dL 10.2(L) 9.4(L) 7.2(L)  Hematocrit 36.0 - 46.0 % 31.7(L) 27.8(L) 22.5(L)  Platelets 150 - 400 K/uL 285 148(L) 145(L)      Brief HPI:   [name] is a 68 y.o. [place] female with history of HTN, hyperlipidemia; who was getting off the ladder when she fell backward with immediate onset of left leg pain. She was evaluated in ED 07/02/14 and was found to have left communited displaced fracture of the midshaft of the femur. She was evaluated by Dr. Charlann Boxerlin and underwent ORIF left mid shaft femur fracture. Post op PWB LLE and on lovenox for DVT prophylaxis. She developed ABLA with drop in hgb to 7.2 and she was transfused with one unit PRBC. She continues to have persistent hypokalemia. Anxiety levels improving with patient showing progress as well as increased ability to maintain PWB with therapy.MD and PT recommending CIR for follow up therapy.    Hospital Course: [name] was admitted to rehab 07/08/2014 for inpatient therapies to consist of PT and OT at least three hours five days a week. Past admission physiatrist, therapy team  and rehab RN have worked together to provide customized collaborative inpatient rehab. Vitals were monitored on tid basis and blood pressures have been reasonably controlled on norvasc. She has been afebrile during her stay and anxiety levels have greatly improved. She was maintained on Lovenox for DVT prophylaxis and was advised to use baby ASA for DVT prophylaxis past discharge. Hypokalemia was supplement briefly and follow up labs show that this has resolved. ABLA is stable on iron supplement. Left hip incision is healing well without signs or symptoms of infection.   Bowel program was adjusted with resolution of narcotic induced constipation. She was premedicated prior to therapy sessions and this has helped with pain control as well as activity tolerance. Ice has been used additionally on tid basis. Patient has shown good progress and is at intermittent supervision level at discharge. She will continue to receive HHPT and HHRN by [facility] past discharge.    Rehab course: During patient's stay in rehab weekly team conferences were held to monitor patient's progress, set goals and discuss barriers to discharge. Patient has had improvement in activity tolerance, balance, postural control, as well as ability to compensate for deficits. She is able to complete ADL tasks at modified independent level with use of AE. She independent for ambulating >300' with RW in controlled and home environment. She requires supervision when in community setting and to navigate 12 stairs.  Family education was done with grandson regarding need to maintain PWB as well as cues/assistance needed past discharge.     Disposition: Home   Diet: Regular.   Special Instructions: 1. Continue partial weight bearing on left leg. 2. Use baby ASA for a month for DVT prophylaxis.      Medication List    STOP taking these medications        enoxaparin 30 MG/0.3ML injection  Commonly known as:  LOVENOX      TAKE these  medications        acetaminophen 325 MG tablet  Commonly known as:  TYLENOL  Take 1-2 tablets (325-650 mg total) by mouth every 4 (four) hours as needed for mild pain.     amLODipine 5 MG tablet  Commonly known as:  NORVASC  Take 0.5 tablets (2.5 mg total) by mouth daily.     aspirin EC 81 MG tablet  Take 1 tablet (81 mg total) by mouth daily. To use as blood thinner for a month.     DSS 100 MG Caps  Take 100 mg by mouth 2 (two) times daily.     ferrous sulfate 325 (65 FE) MG tablet  Take 1 tablet (325 mg total) by mouth 2 (two) times daily with a meal.     HYDROcodone-acetaminophen 5-325 MG per tablet--Rx # 90 pills  Commonly known as:  NORCO/VICODIN  Take 1 tablet by mouth every 4 (four) hours as needed for moderate pain.     methocarbamol 500 MG tablet--Rx #15 pills   Commonly known as:  ROBAXIN  Take 1 tablet (500 mg total) by mouth every 6 (six) hours as needed for muscle spasms.     polyethylene glycol packet  Commonly known as:  MIRALAX / GLYCOLAX  Take 17 g by mouth daily. To prevent constipation while on pain medications     pravastatin 20 MG tablet  Commonly known as:  PRAVACHOL  Take 20 mg by mouth daily.     Vitamin D (Ergocalciferol) 50000 UNITS Caps capsule  Commonly known as:  DRISDOL  Take 50,000 Units by mouth every 7 (seven) days. Mondays       Follow-up Information    Follow up with Erick ColaceKIRSTEINS,ANDREW E, MD.   Specialty:  Physical Medicine and Rehabilitation   Why:  As needed   Contact information:   [address] [place] [address] [phone]       Follow up with Shelda PalLIN,MATTHEW D, MD. Call today.   Specialty:  Orthopedic Surgery   Why:  for follow up appointment   Contact information:   [address] [place] [address] [phone]       Signed: Jacquelynn CreeLove, Camdan Burdi S 07/21/2014, 4:48 PM

## 2014-07-14 NOTE — Plan of Care (Signed)
Pt discharged home with family. Discharge instructions provided by Marissa NestlePam Love, PA. All questions answered, pt verbalized understanding. Vicodin x 1 given for discomfort prior to discharge. 6pm Ferrous Sulfate given as well. Pt escorted off unit in w/c with personal belonging by Cira Ruehelsa and Tomysha, Nurse Techs.

## 2014-07-14 NOTE — Discharge Instructions (Signed)
Inpatient Rehab Discharge Instructions  [name] Discharge date and time:  07/14/14  Activities/Precautions/ Functional Status: Activity: activity as tolerated. .  Diet: regular diet Wound Care: Wash with soap and water. Pat dry. Contact surgeon if you notice redness, swelling or drainage from the incision, increased pain, fever or chills.   Functional status:  ___ No restrictions     ___ Walk up steps independently _X__ 24/7 supervision/assistance   _X__ Walk up steps with assistance ___ Intermittent supervision/assistance  ___ Bathe/dress independently ___ Walk with walker     ___ Bathe/dress with assistance ___ Walk Independently    ___ Shower independently _X__ Walk with supervision.                _X__ Shower with assistance _X__ No alcohol     ___ Return to work/school ________  Special Instructions: 1.  Continue partial weight on Left leg till cleared by surgeon 2. Note decrease in blood pressure medication.  3. Need to call as soon as possible for follow up with your primary doctor in next 1-2 week for post hospital follow up.   COMMUNITY REFERRALS UPON DISCHARGE:    Home Health:   PT & RN  Agency:[facility]    Phone:[phone] Date of last service:07/14/2014  Medical Equipment/Items Karla AversOrdered:YOUTH ROLLING WALKER  Agency/Supplier:[facility]    [phone]    My questions have been answered and I understand these instructions. I will adhere to these goals and the provided educational materials after my discharge from the hospital.  Patient/Caregiver Signature _______________________________ Date __________  Clinician Signature _______________________________________ Date __________  Please bring this form and your medication list with you to all your follow-up doctor's appointments.

## 2014-07-14 NOTE — Plan of Care (Signed)
Problem: RH Balance Goal: LTG Patient will maintain dynamic standing with ADLs (OT) LTG: Patient will maintain dynamic standing balance with assist during activities of daily living (OT)  Outcome: Completed/Met Date Met:  07/14/14  Problem: RH Grooming Goal: LTG Patient will perform grooming w/assist,cues/equip (OT) LTG: Patient will perform grooming with assist, with/without cues using equipment (OT)  Outcome: Completed/Met Date Met:  07/14/14  Problem: RH Bathing Goal: LTG Patient will bathe with assist, cues/equipment (OT) LTG: Patient will bathe specified number of body parts with assist with/without cues using equipment (position) (OT)  Outcome: Completed/Met Date Met:  07/14/14  Problem: RH Dressing Goal: LTG Patient will perform upper body dressing (OT) LTG Patient will perform upper body dressing with assist, with/without cues (OT).  Outcome: Completed/Met Date Met:  07/14/14 Goal: LTG Patient will perform lower body dressing w/assist (OT) LTG: Patient will perform lower body dressing with assist, with/without cues in positioning using equipment (OT)  Outcome: Completed/Met Date Met:  07/14/14  Problem: RH Toileting Goal: LTG Patient will perform toileting w/assist, cues/equip (OT) LTG: Patient will perform toiletiing (clothes management/hygiene) with assist, with/without cues using equipment (OT)  Outcome: Completed/Met Date Met:  07/14/14  Problem: RH Simple Meal Prep Goal: LTG Patient will perform simple meal prep w/assist (OT) LTG: Patient will perform simple meal prep with assistance, with/without cues (OT).  Outcome: Completed/Met Date Met:  07/14/14  Problem: RH Toilet Transfers Goal: LTG Patient will perform toilet transfers w/assist (OT) LTG: Patient will perform toilet transfers with assist, with/without cues using equipment (OT)  Outcome: Completed/Met Date Met:  07/14/14  Problem: RH Tub/Shower Transfers Goal: LTG Patient will perform tub/shower transfers  w/assist (OT) LTG: Patient will perform tub/shower transfers with assist, with/without cues using equipment (OT)  Outcome: Completed/Met Date Met:  07/14/14

## 2014-07-14 NOTE — Plan of Care (Signed)
Problem: RH SKIN INTEGRITY Goal: RH STG SKIN FREE OF INFECTION/BREAKDOWN With min assist  Outcome: Completed/Met Date Met:  07/14/14

## 2014-07-14 NOTE — Progress Notes (Signed)
Subjective/Complaints: Asking about discharge today, no new pain c/os Objective:  Review of Systems - Negative except above Vital Signs: Blood pressure 150/57, pulse 73, temperature 97.9 F (36.6 C), temperature source Oral, resp. rate 18, weight 43.046 kg (94 lb 14.4 oz), SpO2 100 %. No results found. No results found for this or any previous visit (from the past 72 hour(s)).   HEENT: normal Cardio: RRR Resp: CTA B/L and unlabored GI: BS positive and NT, ND Extremity:  Pulses positive and No Edema Skin:   Intact and Wound C/D/I Neuro: Abnormal Motor 5/5 in BUE and RLE, 2- Left HF, KE pain inhibition Musc/Skel:  Swelling Left thigh, incisions with dermabond no drainage Gen NAD   Assessment/Plan: 1. Functional deficits secondary to  left mid shaft femur fracture, PWB  Stable for D/C today F/u PCP in 1-2 weeks F/u PM&R 3 weeks See D/C summary See D/C instructions FIM: FIM - Bathing Bathing Steps Patient Completed: Chest, Right Arm, Left Arm, Abdomen, Front perineal area, Buttocks, Right upper leg, Left upper leg, Right lower leg (including foot), Left lower leg (including foot) Bathing: 6: Assistive device (Comment)  FIM - Upper Body Dressing/Undressing Upper body dressing/undressing steps patient completed: Put head through opening of pull over shirt/dress, Thread/unthread left sleeve of pullover shirt/dress, Thread/unthread right sleeve of pullover shirt/dresss, Pull shirt over trunk Upper body dressing/undressing: 6: More than reasonable amount of time FIM - Lower Body Dressing/Undressing Lower body dressing/undressing steps patient completed: Thread/unthread right pants leg, Pull pants up/down, Don/Doff right sock, Thread/unthread left pants leg, Don/Doff left sock, Thread/unthread right underwear leg, Thread/unthread left underwear leg, Pull underwear up/down Lower body dressing/undressing: 5: Set-up assist to: Obtain clothing  FIM - Toileting Toileting steps completed by  patient: Adjust clothing prior to toileting, Performs perineal hygiene, Adjust clothing after toileting Toileting: 5: Supervision: Safety issues/verbal cues  FIM - Diplomatic Services operational officerToilet Transfers Toilet Transfers Assistive Devices: Grab bars, Art gallery managerWalker Toilet Transfers: 5-To toilet/BSC: Supervision (verbal cues/safety issues), 5-From toilet/BSC: Supervision (verbal cues/safety issues)  FIM - BankerBed/Chair Transfer Bed/Chair Transfer Assistive Devices: Walker, Arm rests Bed/Chair Transfer: 5: Supine > Sit: Supervision (verbal cues/safety issues), 5: Sit > Supine: Supervision (verbal cues/safety issues), 5: Chair or W/C > Bed: Supervision (verbal cues/safety issues), 5: Bed > Chair or W/C: Supervision (verbal cues/safety issues)  FIM - Locomotion: Wheelchair Distance: 150 Locomotion: Wheelchair: 0: Activity did not occur FIM - Locomotion: Ambulation Locomotion: Ambulation Assistive Devices: Designer, industrial/productWalker - Rolling Ambulation/Gait Assistance: 5: Supervision Locomotion: Ambulation: 5: Travels 150 ft or more with supervision/safety issues  Comprehension Comprehension Mode: Auditory Comprehension: 7-Follows complex conversation/direction: With no assist  Expression Expression Mode: Verbal Expression: 7-Expresses complex ideas: With no assist  Social Interaction Social Interaction: 7-Interacts appropriately with others - No medications needed.  Problem Solving Problem Solving: 7-Solves complex problems: Recognizes & self-corrects  Memory Memory: 7-Complete Independence: No helper  Medical Problem List and Plan: 1. Functional deficits secondary to left mid shaft femur fracture 2. DVT Prophylaxis/Anticoagulation: Pharmaceutical: Lovenox 3. Pain Management: 5/10 Will continue hydrocodone prn but premedicate prior to therapy sessions. Ice left hip tid and prn 4. Mood: LCSW to follow with for evaluation and support.  5. Neuropsych: This patient is capable of making decisions on her own behalf. 6. Skin/Wound  Care: change foam dressing today, leave on for 3 d and then d/c 7. Fluids/Electrolytes/Nutrition: Monitor I/O. Offer supplements if intake poor. Add protein supplement to help promote healing.  8. ABLA: Continue iron supplement. Recheck labs in am.  9. Hypokalemia: Likely dilutional. Discontinue  IVF. Add oral supplement and recheck labs in am.  10 HTN: Will monitor every 8 hours. Continue Norvasc daily.     LOS (Days) 6 A FACE TO FACE EVALUATION WAS PERFORMED  KIRSTEINS,ANDREW E 07/14/2014, 8:16 AM

## 2014-07-14 NOTE — Plan of Care (Signed)
Problem: RH Ambulation Goal: LTG Patient will ambulate in home environment (PT) LTG: Patient will ambulate in home environment, # of feet with assistance (PT).  Outcome: Completed/Met Date Met:  07/14/14

## 2014-07-16 NOTE — Progress Notes (Signed)
Orthopedic Tech Progress Note Patient Details:  [name] 1946-05-07 [id number] abd binder was returned unused on 07/16/14. Patient ID: [name], female   DOB: 1946-05-07, 68 y.o.   MRN: [id number]   Jennye MoccasinHughes, Shannell Mikkelsen Craig 07/16/2014, 3:29 PM

## 2014-09-30 DIAGNOSIS — W11XXXD Fall on and from ladder, subsequent encounter: Secondary | ICD-10-CM | POA: Diagnosis not present

## 2014-09-30 DIAGNOSIS — D62 Acute posthemorrhagic anemia: Secondary | ICD-10-CM | POA: Diagnosis not present

## 2014-09-30 DIAGNOSIS — S72351D Displaced comminuted fracture of shaft of right femur, subsequent encounter for closed fracture with routine healing: Secondary | ICD-10-CM | POA: Diagnosis not present

## 2014-09-30 DIAGNOSIS — I10 Essential (primary) hypertension: Secondary | ICD-10-CM | POA: Diagnosis not present

## 2014-10-07 DIAGNOSIS — D62 Acute posthemorrhagic anemia: Secondary | ICD-10-CM

## 2014-10-07 DIAGNOSIS — S72351D Displaced comminuted fracture of shaft of right femur, subsequent encounter for closed fracture with routine healing: Secondary | ICD-10-CM

## 2014-10-07 DIAGNOSIS — W11XXXD Fall on and from ladder, subsequent encounter: Secondary | ICD-10-CM

## 2014-10-07 DIAGNOSIS — I10 Essential (primary) hypertension: Secondary | ICD-10-CM

## 2015-11-13 IMAGING — CR DG CHEST 1V PORT
1 series · 1 of 1 positions shown · non-contrast
Comparison: None.

CLINICAL DATA: Patient fell approximately 3 feet

EXAM:
PORTABLE CHEST - 1 VIEW

[AP]
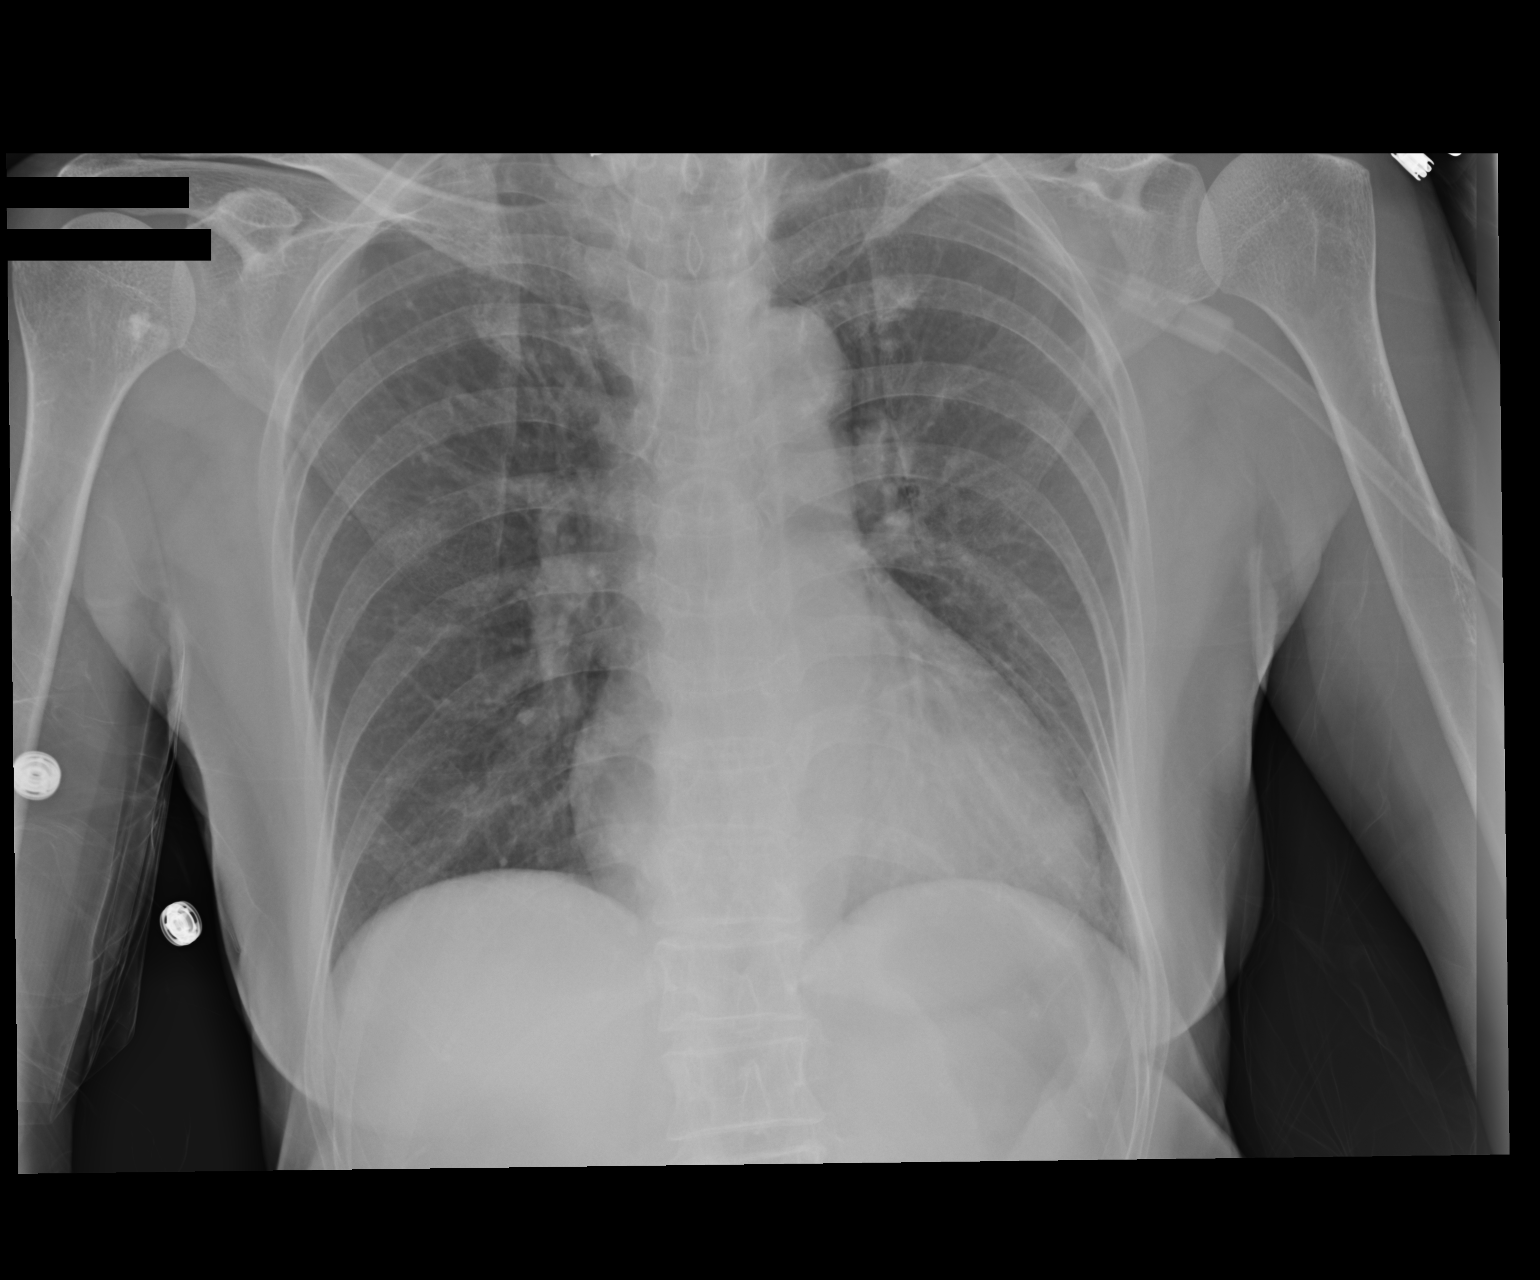

[1 of 1 positions shown; findings below may reference images not displayed]

FINDINGS: There is an area of calcification either in or overlying the right
apex. Elsewhere lungs are clear. Heart is upper normal in size with
pulmonary vascularity within normal limits. No pneumothorax. No
adenopathy. No fractures are apparent. There is a small bone island
in the proximal right humerus.
IMPRESSION: No edema or consolidation.  No apparent pneumothorax.

## 2015-11-13 IMAGING — CR DG FEMUR 2+V PORT*L*
2 series · 2 of 2 positions shown · non-contrast
Comparison: None.

CLINICAL DATA: Trauma.  Fall from a 3 foot height.  Hypertension.

EXAM:
PORTABLE LEFT FEMUR - 2 VIEW

[AP]
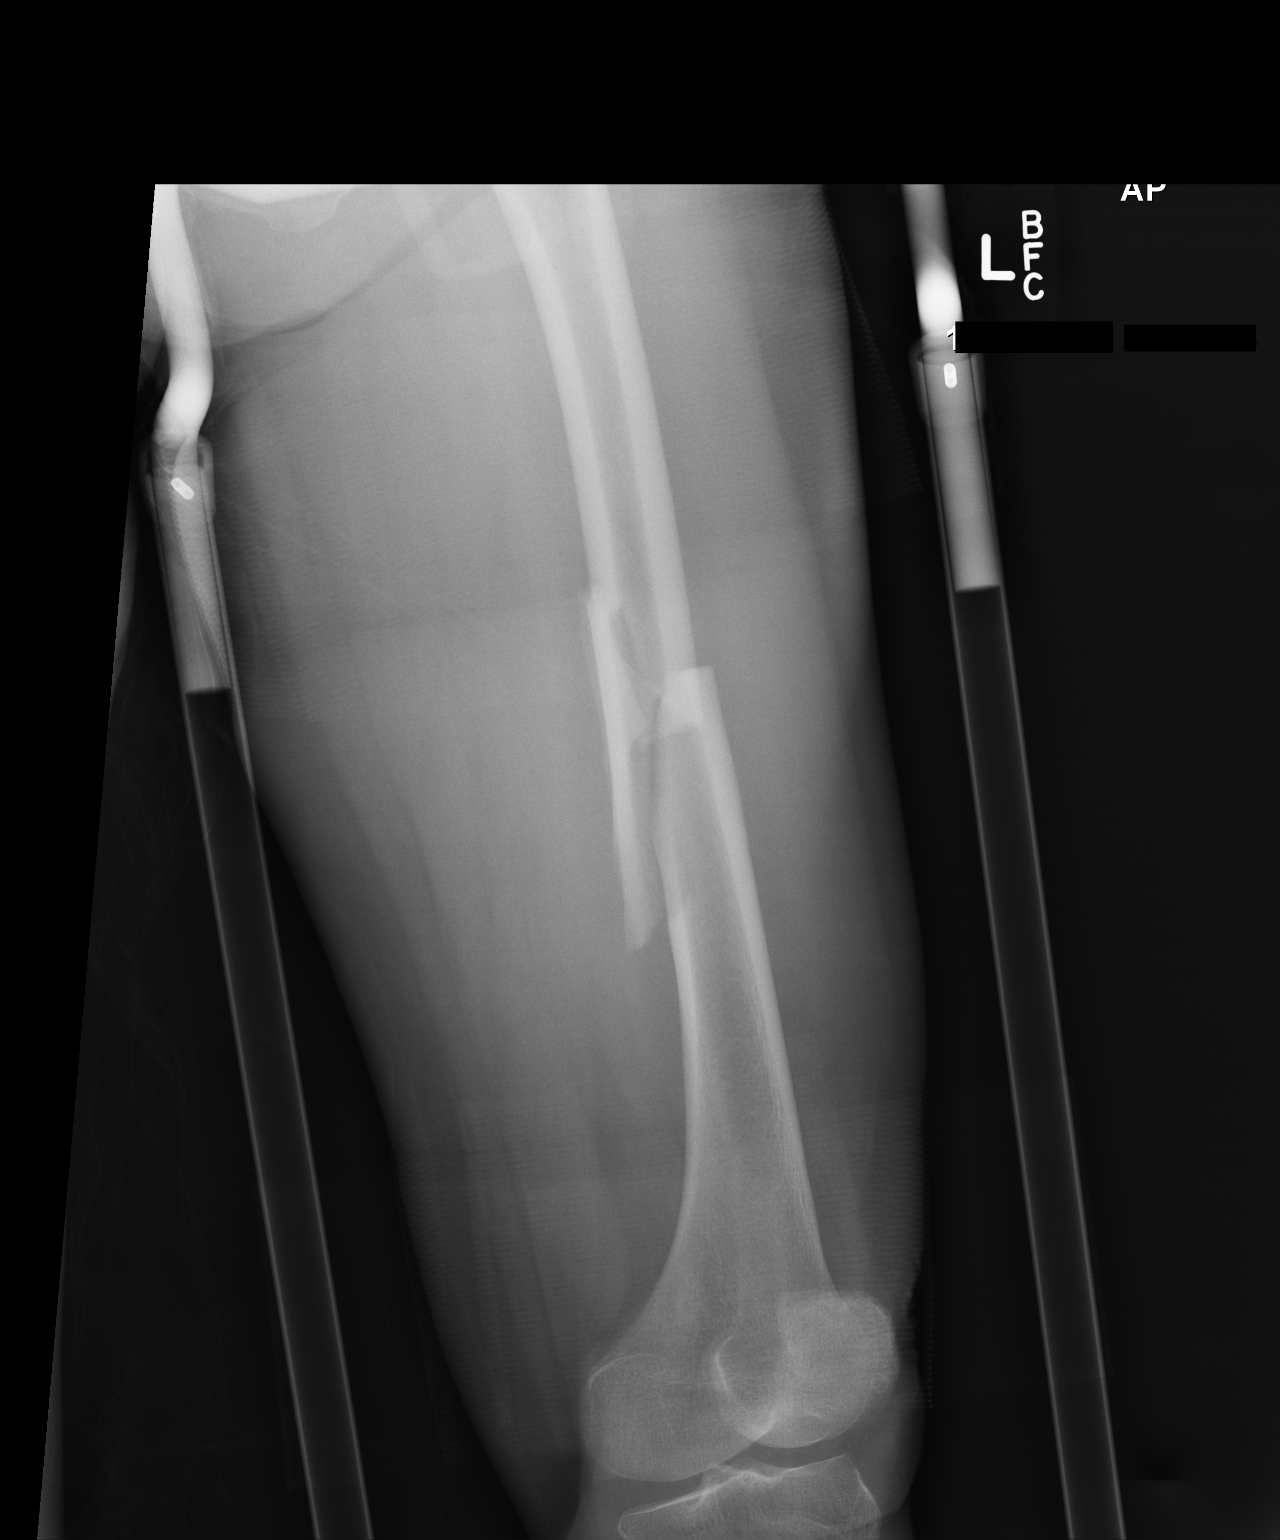

[xtable lateral]
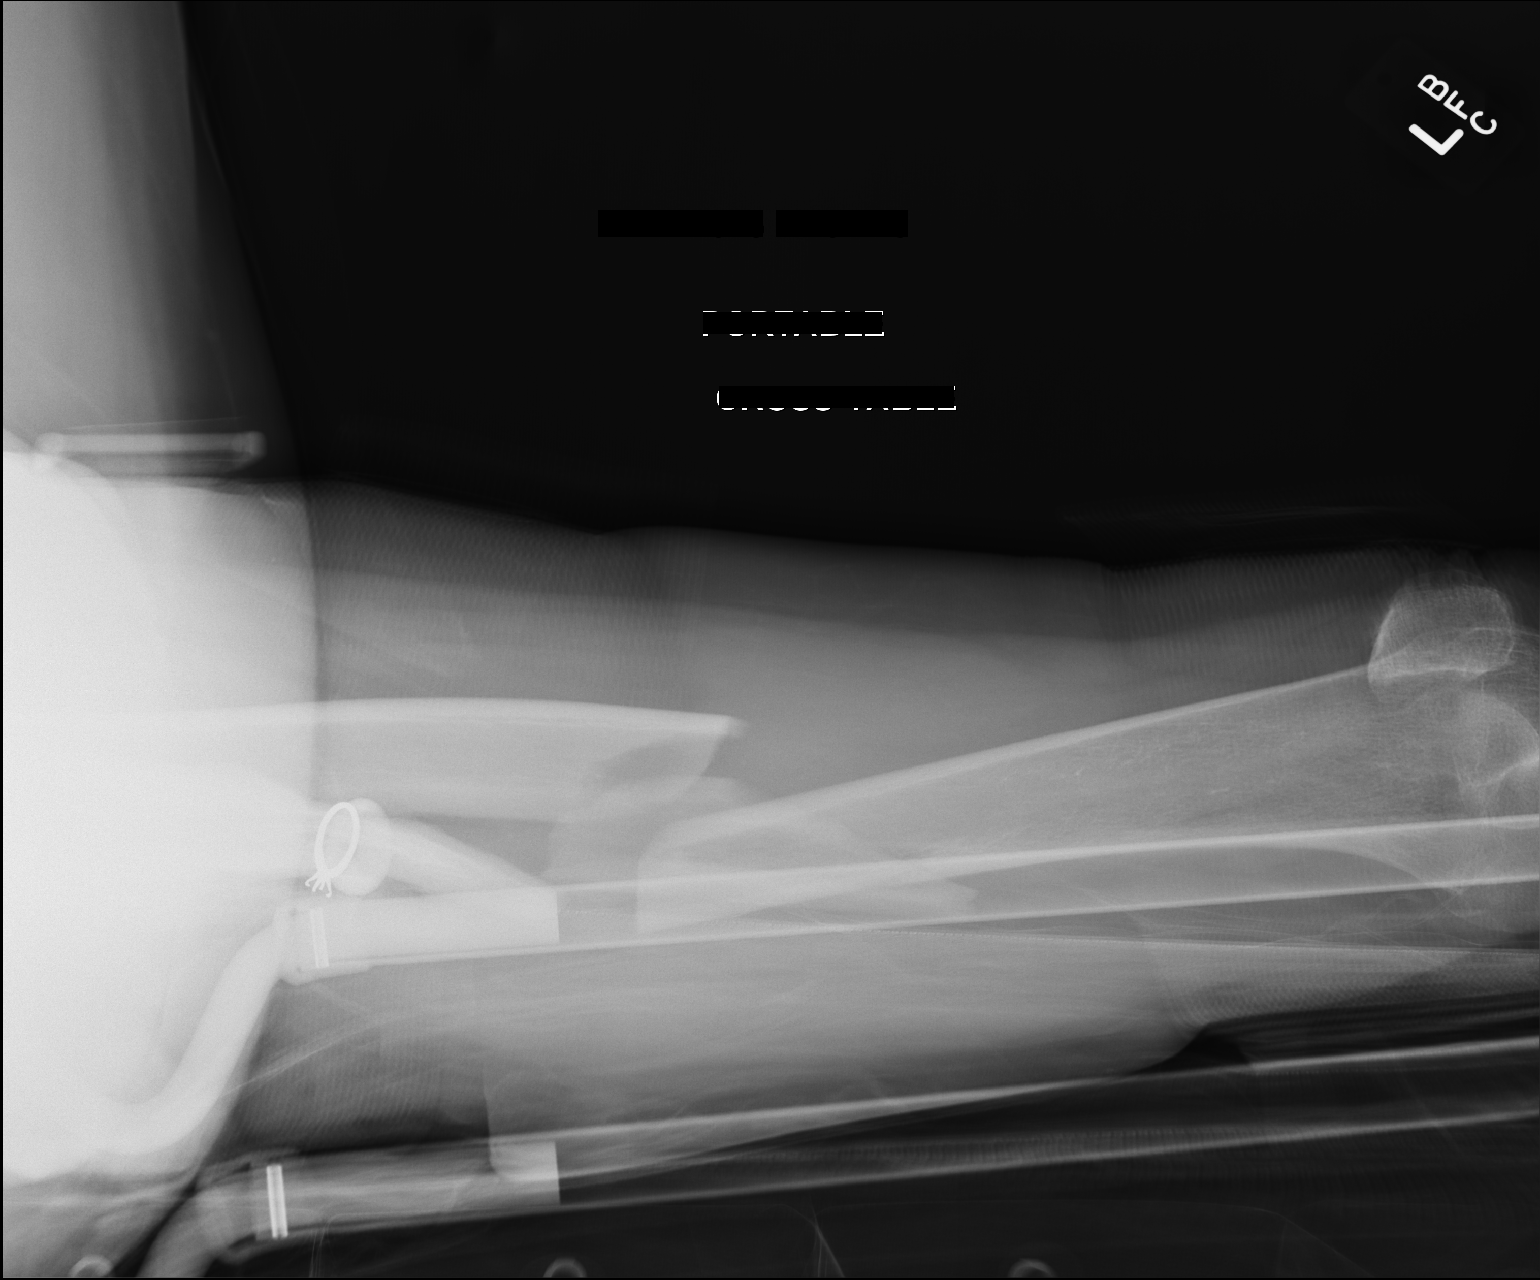

[2 of 2 positions shown; findings below may reference images not displayed]

FINDINGS: Comminuted displaced fracture of the midshaft of the femur noted
with a intermediary butterfly fragment displaced posteriorly 1.7 cm
from the proximal fragment, and with the distal fragment posteriorly
displaced about 3 cm from the proximal fragment. 11 degrees of
angulation between the main proximal fragment in the main distal
fragment.
IMPRESSION: 1. Mildly comminuted midshaft femur fracture, moderately displaced
as detailed above.

## 2015-11-13 IMAGING — RF DG FEMUR 2V*L*
1 series · 5 of 5 positions shown · non-contrast
Comparison: Radiography from earlier the same day

CLINICAL DATA: ORIF of a left femur fracture

EXAM:
LEFT FEMUR - 2 VIEW; DG C-ARM 61-120 MIN

[Series 1: run · 5 of 5 slices shown]
[im 1/5]
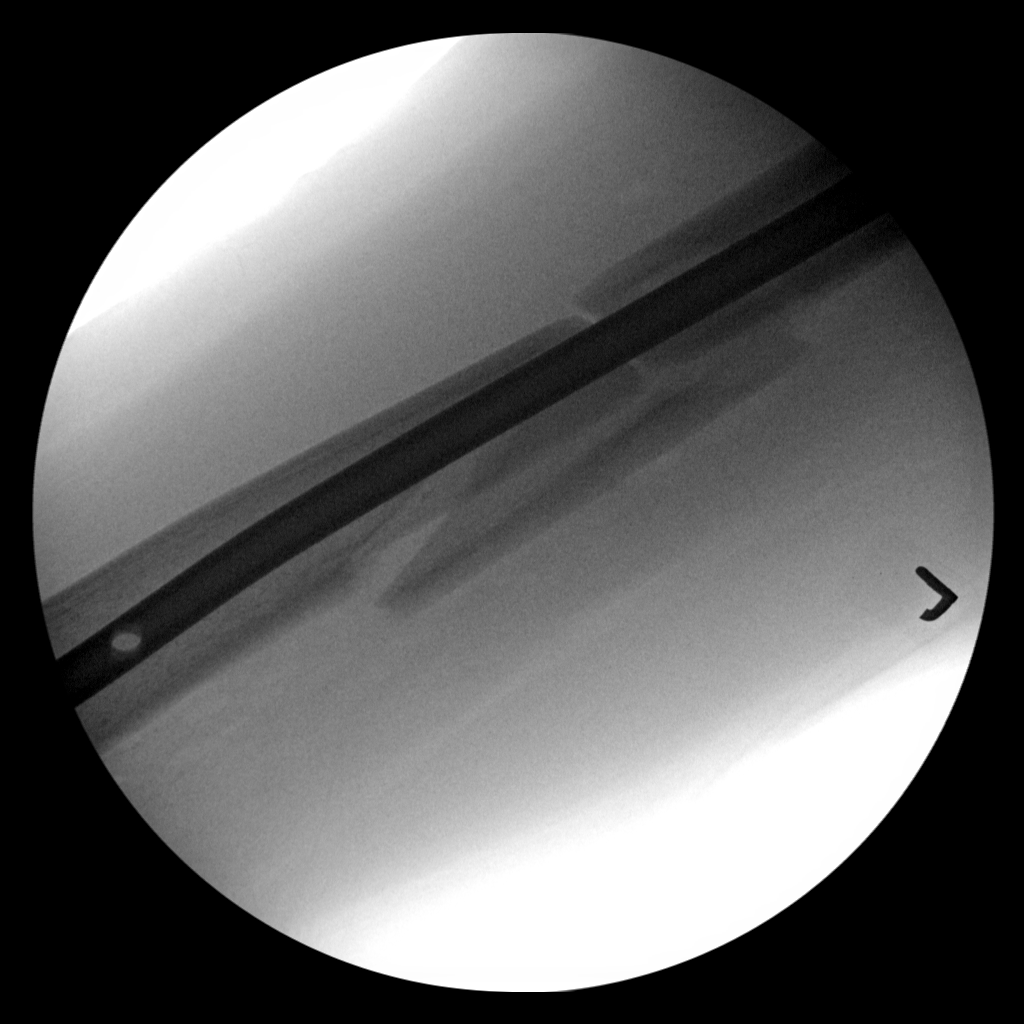
[im 2/5]
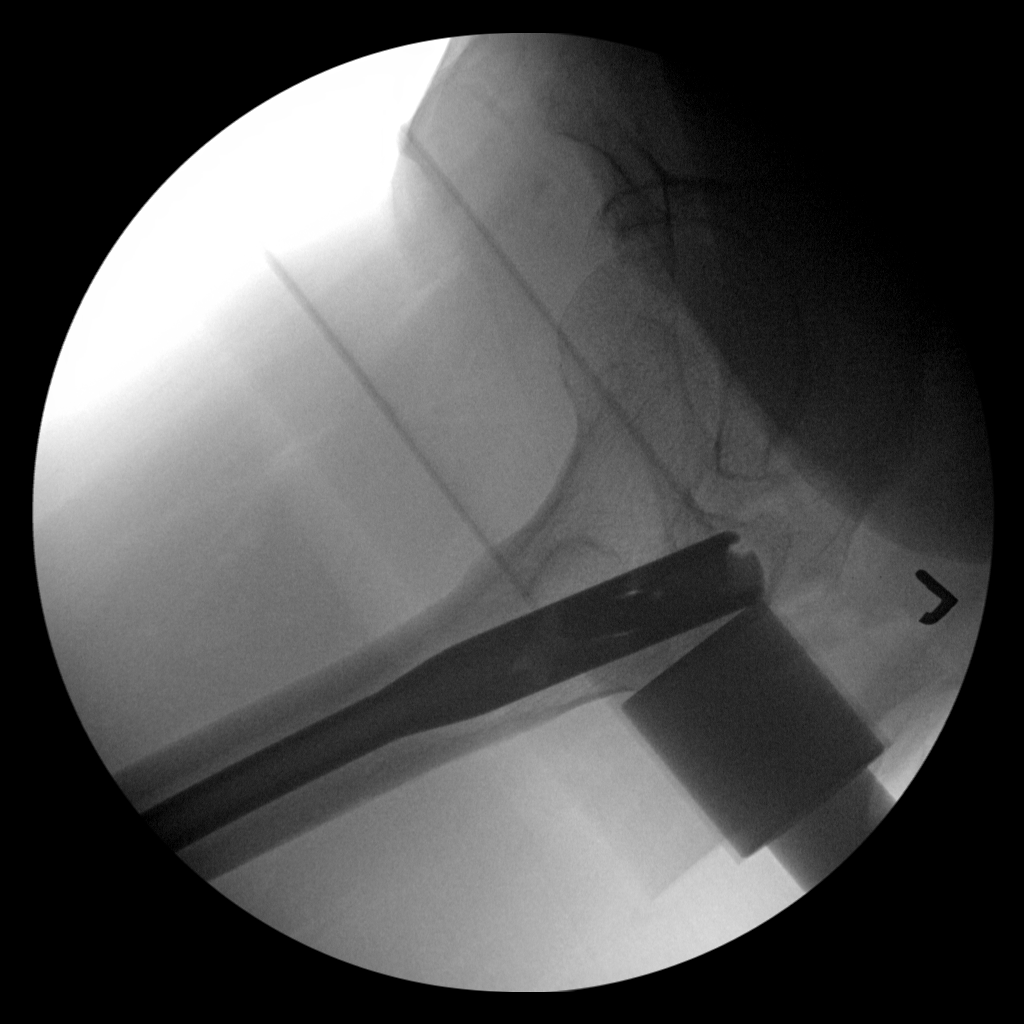
[im 3/5]
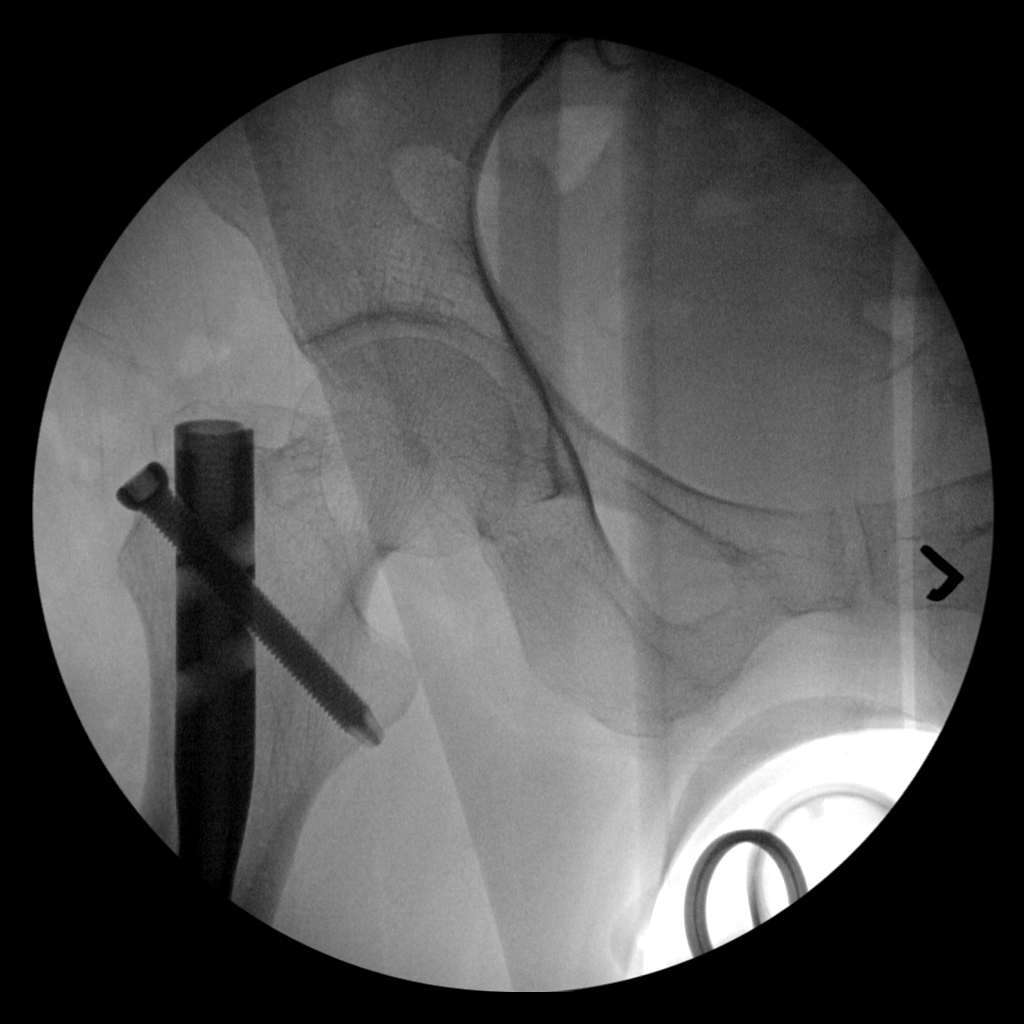
[im 4/5]
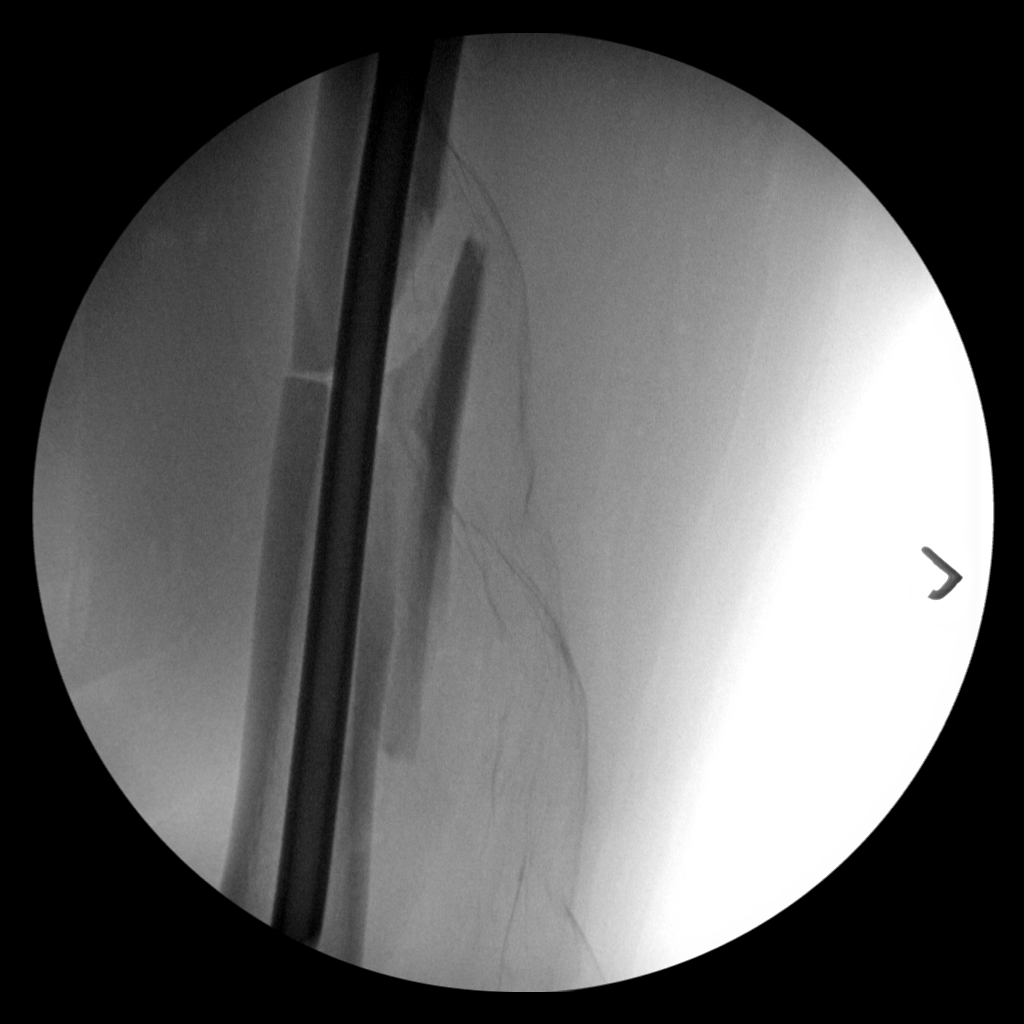
[im 5/5]
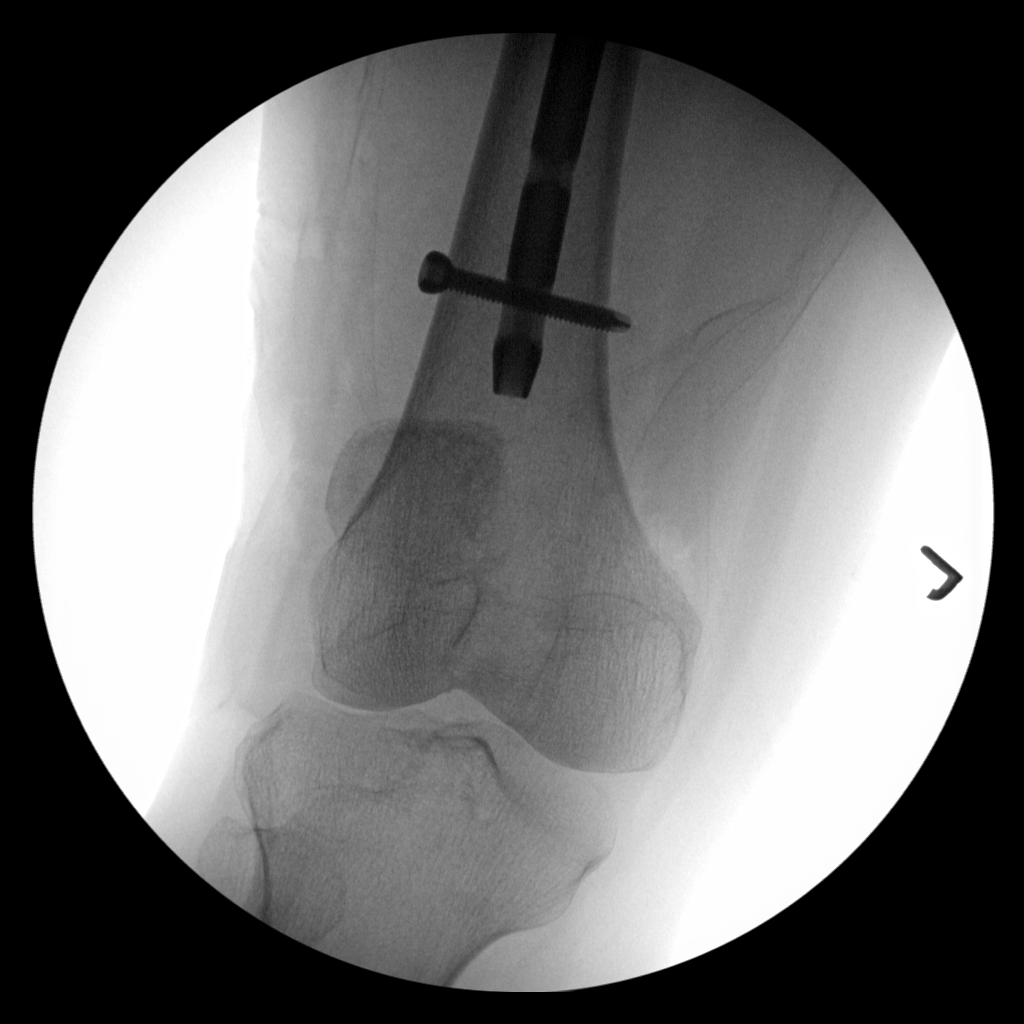

[5 of 5 positions shown; findings below may reference images not displayed]

FINDINGS: Fluoroscopy shows placement of an antegrade intra medullary femoral
nail for fixation of a comminuted diaphysis fracture. There is
anatomic alignment of the diaphysis, with similar displacement of a
isolated posterior cortex fragment. No new fracture.
IMPRESSION: Left femur diaphysis fracture ORIF.
